# Patient Record
Sex: Female | Born: 1995 | Race: Black or African American | Hispanic: No | Marital: Single | Smoking: Never smoker
Health system: Southern US, Community
[De-identification: ages and names within clinical notes are randomized; demographics above are authoritative.]

## PROBLEM LIST (undated history)

## (undated) DIAGNOSIS — F419 Anxiety disorder, unspecified: Secondary | ICD-10-CM

---
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
## 2020-08-18 ENCOUNTER — Encounter (HOSPITAL_BASED_OUTPATIENT_CLINIC_OR_DEPARTMENT_OTHER): Payer: Self-pay | Admitting: Emergency Medicine

## 2020-08-18 ENCOUNTER — Emergency Department (HOSPITAL_BASED_OUTPATIENT_CLINIC_OR_DEPARTMENT_OTHER): Payer: BC Managed Care – PPO

## 2020-08-18 ENCOUNTER — Other Ambulatory Visit: Payer: Self-pay

## 2020-08-18 ENCOUNTER — Emergency Department (HOSPITAL_BASED_OUTPATIENT_CLINIC_OR_DEPARTMENT_OTHER)
Admission: EM | Admit: 2020-08-18 | Discharge: 2020-08-18 | Disposition: A | Discharge status: Home / Self Care | Payer: BC Managed Care – PPO | Attending: Emergency Medicine | Admitting: Emergency Medicine

## 2020-08-18 DIAGNOSIS — R4182 Altered mental status, unspecified: Secondary | ICD-10-CM

## 2020-08-18 DIAGNOSIS — R202 Paresthesia of skin: Secondary | ICD-10-CM | POA: Diagnosis not present

## 2020-08-18 DIAGNOSIS — R451 Restlessness and agitation: Secondary | ICD-10-CM | POA: Insufficient documentation

## 2020-08-18 DIAGNOSIS — Z20822 Contact with and (suspected) exposure to covid-19: Secondary | ICD-10-CM | POA: Diagnosis not present

## 2020-08-18 DIAGNOSIS — F410 Panic disorder [episodic paroxysmal anxiety] without agoraphobia: Secondary | ICD-10-CM | POA: Diagnosis not present

## 2020-08-18 DIAGNOSIS — R410 Disorientation, unspecified: Secondary | ICD-10-CM | POA: Insufficient documentation

## 2020-08-18 LAB — CBC WITH DIFFERENTIAL/PLATELET
Abs Immature Granulocytes: 0.1 10*3/uL — ABNORMAL HIGH (ref 0.00–0.07)
Basophils Absolute: 0 10*3/uL (ref 0.0–0.1)
Basophils Relative: 0 %
Eosinophils Absolute: 0 10*3/uL (ref 0.0–0.5)
Eosinophils Relative: 0 %
HCT: 37 % (ref 36.0–46.0)
Hemoglobin: 12.4 g/dL (ref 12.0–15.0)
Immature Granulocytes: 1 %
Lymphocytes Relative: 4 %
Lymphs Abs: 0.8 10*3/uL (ref 0.7–4.0)
MCH: 32.1 pg (ref 26.0–34.0)
MCHC: 33.5 g/dL (ref 30.0–36.0)
MCV: 95.9 fL (ref 80.0–100.0)
Monocytes Absolute: 0.5 10*3/uL (ref 0.1–1.0)
Monocytes Relative: 3 %
Neutro Abs: 17 10*3/uL — ABNORMAL HIGH (ref 1.7–7.7)
Neutrophils Relative %: 92 %
Platelets: 261 10*3/uL (ref 150–400)
RBC: 3.86 MIL/uL — ABNORMAL LOW (ref 3.87–5.11)
RDW: 11.9 % (ref 11.5–15.5)
WBC: 18.4 10*3/uL — ABNORMAL HIGH (ref 4.0–10.5)
nRBC: 0 % (ref 0.0–0.2)

## 2020-08-18 LAB — URINALYSIS, COMPLETE (UACMP) WITH MICROSCOPIC
Bilirubin Urine: NEGATIVE
Glucose, UA: NEGATIVE mg/dL
Hgb urine dipstick: NEGATIVE
Ketones, ur: 40 mg/dL — AB
Nitrite: NEGATIVE
Protein, ur: NEGATIVE mg/dL
Specific Gravity, Urine: 1.015 (ref 1.005–1.030)
pH: 8.5 — ABNORMAL HIGH (ref 5.0–8.0)

## 2020-08-18 LAB — RESPIRATORY PANEL BY RT PCR (FLU A&B, COVID)
Influenza A by PCR: NEGATIVE
Influenza B by PCR: NEGATIVE
SARS Coronavirus 2 by RT PCR: NEGATIVE

## 2020-08-18 LAB — SALICYLATE LEVEL: Salicylate Lvl: <7 mg/dL — ABNORMAL LOW (ref 7.0–30.0)

## 2020-08-18 LAB — RAPID URINE DRUG SCREEN, HOSP PERFORMED
Amphetamines: NOT DETECTED
Barbiturates: NOT DETECTED
Benzodiazepines: POSITIVE — AB
Cocaine: NOT DETECTED
Opiates: NOT DETECTED
Tetrahydrocannabinol: NOT DETECTED

## 2020-08-18 LAB — COMPREHENSIVE METABOLIC PANEL
ALT: 14 U/L (ref 0–44)
AST: 21 U/L (ref 15–41)
Albumin: 4.7 g/dL (ref 3.5–5.0)
Alkaline Phosphatase: 42 U/L (ref 38–126)
Anion gap: 11 (ref 5–15)
BUN: 7 mg/dL (ref 6–20)
CO2: 25 mmol/L (ref 22–32)
Calcium: 9.4 mg/dL (ref 8.9–10.3)
Chloride: 101 mmol/L (ref 98–111)
Creatinine, Ser: 0.93 mg/dL (ref 0.44–1.00)
GFR calc Af Amer: >60 mL/min (ref >60)
GFR calc non Af Amer: >60 mL/min (ref >60)
Glucose, Bld: 135 mg/dL — ABNORMAL HIGH (ref 70–99)
Potassium: 3.7 mmol/L (ref 3.5–5.1)
Sodium: 137 mmol/L (ref 135–145)
Total Bilirubin: 0.4 mg/dL (ref 0.3–1.2)
Total Protein: 8.5 g/dL — ABNORMAL HIGH (ref 6.5–8.1)

## 2020-08-18 LAB — ETHANOL: Alcohol, Ethyl (B): <10 mg/dL (ref <10)

## 2020-08-18 LAB — AMMONIA: Ammonia: 17 umol/L (ref 9–35)

## 2020-08-18 LAB — CBG MONITORING, ED: Glucose-Capillary: 145 mg/dL — ABNORMAL HIGH (ref 70–99)

## 2020-08-18 LAB — CK: Total CK: 262 U/L — ABNORMAL HIGH (ref 38–234)

## 2020-08-18 LAB — ACETAMINOPHEN LEVEL: Acetaminophen (Tylenol), Serum: <10 ug/mL — ABNORMAL LOW (ref 10–30)

## 2020-08-18 LAB — PREGNANCY, URINE: Preg Test, Ur: NEGATIVE

## 2020-08-18 LAB — HCG, QUANTITATIVE, PREGNANCY: hCG, Beta Chain, Quant, S: <1 m[IU]/mL (ref <5)

## 2020-08-18 MED ORDER — DIPHENHYDRAMINE HCL 50 MG/ML IJ SOLN
50.0000 mg | Freq: Once | INTRAMUSCULAR | Status: AC
  Administered 2020-08-18: 50 mg via INTRAMUSCULAR
  Filled 2020-08-18: qty 1

## 2020-08-18 MED ORDER — MIDAZOLAM HCL 2 MG/2ML IJ SOLN
INTRAMUSCULAR | Status: AC
  Filled 2020-08-18: qty 2

## 2020-08-18 MED ORDER — LORAZEPAM 2 MG/ML IJ SOLN: 2.0000 mg | Freq: Once | INTRAMUSCULAR | Status: AC

## 2020-08-18 MED ORDER — HALOPERIDOL LACTATE 5 MG/ML IJ SOLN: 5.0000 mg | Freq: Once | INTRAMUSCULAR | Status: AC

## 2020-08-18 MED ORDER — MIDAZOLAM HCL 2 MG/2ML IJ SOLN
2.0000 mg | Freq: Once | INTRAMUSCULAR | Status: AC
  Administered 2020-08-18: 2 mg via INTRAMUSCULAR

## 2020-08-18 MED ORDER — ZIPRASIDONE MESYLATE 20 MG IM SOLR: 10.0000 mg | Freq: Once | INTRAMUSCULAR | Status: DC

## 2020-08-18 MED ORDER — HYDROXYZINE HCL 25 MG PO TABS: 25.0000 mg | ORAL_TABLET | Freq: Three times a day (TID) | ORAL | 0 refills | Status: DC | PRN

## 2020-08-18 MED ORDER — LORAZEPAM 2 MG/ML IJ SOLN
INTRAMUSCULAR | Status: AC
  Administered 2020-08-18: 2 mg via INTRAMUSCULAR
  Filled 2020-08-18: qty 1

## 2020-08-18 MED ORDER — ACETAMINOPHEN 500 MG PO TABS
1000.0000 mg | ORAL_TABLET | Freq: Once | ORAL | Status: DC
  Filled 2020-08-18: qty 2

## 2020-08-18 MED ORDER — HALOPERIDOL LACTATE 5 MG/ML IJ SOLN
5.0000 mg | Freq: Once | INTRAMUSCULAR | Status: AC
  Administered 2020-08-18: 5 mg via INTRAMUSCULAR
  Filled 2020-08-18: qty 1

## 2020-08-18 MED ORDER — SODIUM CHLORIDE 0.9 % IV BOLUS
1000.0000 mL | Freq: Once | INTRAVENOUS | Status: AC
  Administered 2020-08-18: 1000 mL via INTRAVENOUS

## 2020-08-18 MED ORDER — HALOPERIDOL LACTATE 5 MG/ML IJ SOLN
INTRAMUSCULAR | Status: AC
  Administered 2020-08-18: 5 mg via INTRAMUSCULAR
  Filled 2020-08-18: qty 1

## 2020-08-18 NOTE — ED Notes (Signed)
Per VO Dr. Stevie Kern, rectal temp not needed.  Any route would be fine.

## 2020-08-18 NOTE — ED Notes (Signed)
Pt asked for another pillow.  Nurse left the room and brought one back immediately.  Pt had laid down on the original pillow and when nurse returned with the second pillow pt acted like she didn't know what was going on.  She didn't want another pillow.  Pt is cooperative with mother but seems altered in her thinking processes.

## 2020-08-18 NOTE — BH Assessment (Signed)
Tele Assessment Note   Patient Name: Courtney Mcbride MRN: [id number] Referring Physician: EDP Location of Patient: [facility] EDP Location of Provider: Behavioral Health TTS Department  Courtney Mcbride is a 24 y.o. female who presented to [facility] in a state of confusion and agitation last night.  Pt lives in [place] with her mother and father.  She works for [employer], and she is not followed by an outpatient psychiatrist or therapist.  Pt has never been treated inpatient before.  Pt's mother Haywood Lasso was present for assessment.  Pt had been sedated and was sleepy during assessment.  Per mother, Pt has recently endorsed strong feelings of anxiety and ''tingly sensations'' in her extremities.  Her PCP diagnosed with her low amounts of B12.  Last night, Pt woke her mother in a state of confusion and agitation.  She also reported tingly sensations.  Mother brought her to the hospital for evaluation.  At the hospital, Pt was given sedatives to calm her.  Pt endorsed anxiety, disturbed sleep, and episode of panic.  Pt denied suicidal ideation, past suicide attempt, homicidal ideation, hallucination, self-injurious behavior, and substance use concerns.  Pt and Pt's mother said they would feel safe if Pt returned home.  They also expressed interest in pursuing therapy for treatment of anxiety.  During assessment, Pt presented as alert and oriented.  She had fair eye contact (falling asleep), and she appeared appropriately groomed.  Pt's mood was anxious; Pt's affect was calm (sedated).  Pt's speech was normal in rate, rhythm, and volume.  Pt's thought processes were within normal range, and thought content was logical and goal-oriented.  There was no evidence of delusion.  Pt's memory and concentration were intact.  Insight, judgment, and impulse control were fair to good.  Consulted with L. Maisie Fus, NP, who determined that Pt does not meet inpatient criteria and may be  psych-cleared.  Diagnosis: Panic Disorder  Past Medical History:  Past Medical History:  Diagnosis Date  . Anxiety     History reviewed. No pertinent surgical history.  Family History:  Family History  Problem Relation Age of Onset  . Anxiety disorder Father   . Anxiety disorder Sister     Social History:  reports that she has never smoked. She does not have any smokeless tobacco history on file. She reports that she does not drink alcohol and does not use drugs.  Additional Social History:  Alcohol / Drug Use Pain Medications: See MAR Prescriptions: See MAR Over the Counter: See MAR History of alcohol / drug use?: No history of alcohol / drug abuse  CIWA: CIWA-Ar BP: 123/72 Pulse Rate: (!) 110 COWS:    Allergies: No Known Allergies  Home Medications: (Not in a hospital admission)   OB/GYN Status:  No LMP recorded.  General Assessment Data Location of Assessment: [facility] TTS Assessment: In system Is this a Tele or Face-to-Face Assessment?: Tele Assessment Is this an Initial Assessment or a Re-assessment for this encounter?: Initial Assessment Patient Accompanied by:: Parent Language Other than English: No Living Arrangements: Other (Comment) ([place] with mother and father) What gender do you identify as?: Female Date Telepsych consult ordered in [facility]: 08/18/20 Marital status: Single Maiden name:  Lozano) Pregnancy Status: No Living Arrangements: Parent Can pt return to current living arrangement?: Yes Admission Status: Voluntary Is patient capable of signing voluntary admission?: Yes Referral Source: Self/Family/Friend Insurance type:  Herbalist)     Crisis Care Plan Living Arrangements: Parent Name  of Psychiatrist:  (None) Name of Therapist:  (None)  Education Status Is patient currently in school?: No Is the patient employed, unemployed or receiving disability?: Employed  Risk to self with the past 6 months Suicidal Ideation: No Has  patient been a risk to self within the past 6 months prior to admission? : No Suicidal Intent: No Is patient at risk for suicide?: No Has patient had any suicidal plan within the past 6 months prior to admission? : No Access to Means: No What has been your use of drugs/alcohol within the last 12 months?:  (Denied) Intentional Self Injurious Behavior: None Family Suicide History: No Recent stressful life event(s): Loss (Comment), Other (Comment) (grandfather died; COVID) Persecutory voices/beliefs?: No Depression: No Substance abuse history and/or treatment for substance abuse?: No Suicide prevention information given to non-admitted patients: Not applicable  Risk to Others within the past 6 months Homicidal Ideation: No Does patient have any lifetime risk of violence toward others beyond the six months prior to admission? : No Thoughts of Harm to Others: No Current Homicidal Intent: No Current Homicidal Plan: No Access to Homicidal Means: No History of harm to others?: No Assessment of Violence: None Noted Does patient have access to weapons?: No Criminal Charges Pending?: No Does patient have a court date: No Is patient on probation?: No  Psychosis Hallucinations: None noted Delusions: None noted  Mental Status Report Appearance/Hygiene: Unremarkable, In hospital gown Eye Contact: Fair Motor Activity: Unremarkable Speech: Logical/coherent Level of Consciousness: Sedated Mood: Anxious Affect: Appropriate to circumstance Anxiety Level: Panic Attacks Thought Processes: Coherent, Relevant Judgement: Partial Orientation: Person, Place, Time, Situation Obsessive Compulsive Thoughts/Behaviors: None  Cognitive Functioning Concentration: Normal Memory: Recent Intact, Remote Intact Is patient IDD: No Insight: Fair Impulse Control: Fair Appetite: Good Have you had any weight changes? : No Change Sleep: No Change Total Hours of Sleep:  (8) Vegetative Symptoms:  None  ADLScreening [facility] Assessment Services) Patient's cognitive ability adequate to safely complete daily activities?: Yes Patient able to express need for assistance with ADLs?: Yes Independently performs ADLs?: Yes (appropriate for developmental age)  Prior Inpatient Therapy Prior Inpatient Therapy: No  Prior Outpatient Therapy Prior Outpatient Therapy: No Does patient have an ACCT team?: No Does patient have Intensive In-House Services?  : No Does patient have [facility] services? : No Does patient have P4CC services?: No  ADL Screening (condition at time of admission) Patient's cognitive ability adequate to safely complete daily activities?: Yes Is the patient deaf or have difficulty hearing?: No Does the patient have difficulty seeing, even when wearing glasses/contacts?: No Patient able to express need for assistance with ADLs?: Yes Does the patient have difficulty dressing or bathing?: No Independently performs ADLs?: Yes (appropriate for developmental age) Does the patient have difficulty walking or climbing stairs?: No Weakness of Legs: None Weakness of Arms/Hands: None  Home Assistive Devices/Equipment Home Assistive Devices/Equipment: None  Therapy Consults (therapy consults require a physician order) PT Evaluation Needed: No OT Evalulation Needed: No SLP Evaluation Needed: No Abuse/Neglect Assessment (Assessment to be complete while patient is alone) Abuse/Neglect Assessment Can Be Completed: Yes Physical Abuse: Denies Verbal Abuse: Denies Sexual Abuse: Denies Exploitation of patient/patient's resources: Denies Self-Neglect: Denies Values / Beliefs Cultural Requests During Hospitalization: None Spiritual Requests During Hospitalization: None Consults Spiritual Care Consult Needed: No Transition of Care Team Consult Needed: No Advance Directives (For Healthcare) Does Patient Have a Medical Advance Directive?: No          Disposition:   Disposition Initial Assessment  Completed for this Encounter: Yes  This service was provided via telemedicine using a 2-way, interactive audio and video technology.  Names of all persons participating in this telemedicine service and their role in this encounter. Name: Clemencia Helzer Role: Patient  Name: Mckenzi Buonomo Role: Patient's mother          Earline Mayotte 08/18/2020 3:01 PM

## 2020-08-18 NOTE — ED Notes (Signed)
ED Provider at bedside. 

## 2020-08-18 NOTE — ED Notes (Signed)
ED Provider at bedside discussing results and plan of care with pts Mom.

## 2020-08-18 NOTE — ED Provider Notes (Signed)
Signout note  24 year old lady presenting to ER with altered mental status.  Labs, CT head ordered and in process. Likely functional/psychiatric presentation in nature per initial impression of Dr. Bebe Shaggy.   7:30 AM Received signout from Dr. Bebe Shaggy, follow-up labs and imaging, reassess patient  8:16 AM Reassessed patient, initial labs grossly normal, patient resting in bed, awaiting UDS, will reassess and monitor closely, temp 100.9 F  10:19 AM reassessed patient, mentation improving, near baseline, will continue to monitor in ER, will ask TTS to evaluate  12:45 PM reassessed, continues to improve, awaiting TTS consult, temp came down prior to receiving any antipyretic  3:00 PM rechecked, at baseline per patient and mother, awaiting results of TTS consult, signed out to Dr. Silverio Lay. Suspect her presentation was most likely functional/psychiatric/anxiety in nature. Primary doctor following for B12 deficiency. She has no neck pain, stiffness, no ongoing neurologic deficits, doubt CNS infection or acute neurologic process. Given her current reassuring work up and reassuring clinical appearance at present, do not feel she needs additional medical work up and is appropriate for further psychiatric care per TTS recommendations. Anticipate likely out patient management at this point with plan for close PCP and out pt psych clinic.    Milagros Loll, MD 08/18/20 [phone]

## 2020-08-18 NOTE — ED Provider Notes (Signed)
  Physical Exam  BP 123/72   Pulse (!) 110   Temp 100.3 F (37.9 C) (Oral)   Resp (!) 22   Ht 5\' 6"  (1.676 m)   Wt 90.8 kg   SpO2 97%   BMI 32.31 kg/m   Physical Exam  ED Course/Procedures     Procedures  MDM  Patient is here for bizarre behavior and altered mental status.  Patient is back to baseline now.  Psychiatry is consulted and recommended discharge.  Stable for discharge and can follow-up outpatient.    , MD 08/18/20 [phone]

## 2020-08-18 NOTE — ED Notes (Signed)
Unable to get vitals at this time. Patient too agitated. Per provider will reassess after medication has had chance to take effect.

## 2020-08-18 NOTE — ED Notes (Signed)
TTS consult in progress. °

## 2020-08-18 NOTE — Discharge Instructions (Addendum)
Take hydroxyzine as needed for anxiety   See [facility] for follow up   Return to ER if you have worse tremors, lethargy, fever, vomiting, seizures

## 2020-08-18 NOTE — ED Provider Notes (Signed)
[facility] EMERGENCY DEPARTMENT Provider Note   CSN: [id number] Arrival date & time: 08/18/20  0305     History Chief Complaint  Patient presents with  . Altered Mental Status   Level 5 caveat due to altered mental status  Courtney Mcbride is a 24 y.o. female.  The history is provided by a parent. The history is limited by the condition of the patient.  Altered Mental Status Presenting symptoms: combativeness and disorientation   Severity:  Severe Most recent episode:  Today Episode history:  Single Timing:  Constant Progression:  Worsening Chronicity:  New Context: not head injury   Patient with history of anxiety and vitamin B12 deficiency presents with altered mental status.  Mother reports that patient woke up extremely disoriented.  She is speaking incoherently and would not respond to name or commands.  She has never had this before.  Mother reports she was just seen by her PCP for having numbness and paresthesias in her extremities.  She was found to have vitamin B12 deficiency and is started medications for that.  Patient takes birth control, but no other medications.  No recent trauma.  No drug abuse reported. Patient will get occasional migraine but no other acute issues. Patient has been under quite a bit of stress with work and family.      Past Medical History:  Diagnosis Date  . Anxiety     There are no problems to display for this patient.   History reviewed. No pertinent surgical history.   OB History   No obstetric history on file.     No family history on file.  Social History   Tobacco Use  . Smoking status: Never Smoker  Substance Use Topics  . Alcohol use: No  . Drug use: No    Home Medications Prior to Admission medications   Medication Sig Start Date End Date Taking? Authorizing Provider  AUROVELA FE 1/20 1-20 MG-MCG tablet Take 1 tablet by mouth daily. 08/02/20   [provider]  sulfamethoxazole-trimethoprim  (BACTRIM DS) 800-160 MG tablet Take 1 tablet by mouth 2 (two) times daily. 08/09/20   [provider]    Allergies    Patient has no known allergies.  Review of Systems   Review of Systems  Unable to perform ROS: Mental status change    Physical Exam Updated Vital Signs BP (!) 126/92   Resp (!) 22   Ht 1.676 m (5\' 6" )   Wt 90.8 kg   SpO2 98%   BMI 32.31 kg/m   Physical Exam CONSTITUTIONAL: Anxious, disheveled HEAD: Normocephalic/atraumatic, no signs of trauma EYES: EOMI/PERRL ENMT: Mucous membranes moist NECK: supple no meningeal signs SPINE/BACK:entire spine nontender CV: S1/S2 noted, no murmurs/rubs/gallops noted LUNGS: Lungs are clear to auscultation bilaterally, no apparent distress ABDOMEN: soft, nontender/nondistended NEURO: Pt is awake/alert, moves all extremities x4.  Patient is speaking incoherently and will follow commands.  She is intermittently agitated EXTREMITIES: pulses normal/equal, full ROM, no deformities SKIN: warm, color normal PSYCH: Agitated ED Results / Procedures / Treatments   Labs (all labs ordered are listed, but only abnormal results are displayed) Labs Reviewed  CBG MONITORING, ED - Abnormal; Notable for the following components:      Result Value   Glucose-Capillary 145 (*)    All other components within normal limits  RESPIRATORY PANEL BY RT PCR (FLU A&B, COVID)  COMPREHENSIVE METABOLIC PANEL  CBC WITH DIFFERENTIAL/PLATELET  URINALYSIS, COMPLETE (UACMP) WITH MICROSCOPIC  PREGNANCY, URINE  AMMONIA  RAPID URINE DRUG SCREEN, HOSP PERFORMED  HCG, QUANTITATIVE, PREGNANCY  ACETAMINOPHEN LEVEL  ETHANOL  SALICYLATE LEVEL  CK    EKG EKG Interpretation  Date/Time:  Wednesday August 18 2020 06:28:33 EDT Ventricular Rate:  85 PR Interval:    QRS Duration: 78 QT Interval:  343 QTC Calculation: 408 R Axis:   89 Text Interpretation: Incomplete analysis due to missing data in precordial lead(s) Sinus rhythm LVH by voltage  Baseline wander in lead(s) V2 Missing lead(s): V4 No previous ECGs available Confirmed by Zadie Rhine ([id number]) on 08/18/2020 6:35:48 AM   Radiology DG Chest Portable 1 View  Result Date: 08/18/2020 CLINICAL DATA:  Altered mental status today EXAM: PORTABLE CHEST 1 VIEW COMPARISON:  None. FINDINGS: Shallow inspiration. Heart size and pulmonary vascularity are normal. Lungs are clear. No pleural effusions. No pneumothorax. Mediastinal contours appear intact. IMPRESSION: No active disease. Electronically Signed   By: Burman Nieves M.D.   On: 08/18/2020 06:49    Procedures .Critical Care Performed by: Zadie Rhine, MD Authorized by: Zadie Rhine, MD   Critical care provider statement:    Critical care time (minutes):  90   Critical care start time:  08/18/2020 4:00 AM   Critical care end time:  08/18/2020 5:30 AM   Critical care time was exclusive of:  Separately billable procedures and treating other patients   Critical care was necessary to treat or prevent imminent or life-threatening deterioration of the following conditions:  CNS failure or compromise   Critical care was time spent personally by me on the following activities:  Development of treatment plan with patient or surrogate, evaluation of patient's response to treatment, examination of patient, pulse oximetry, ordering and review of radiographic studies, ordering and review of laboratory studies, ordering and performing treatments and interventions, re-evaluation of patient's condition and review of old charts   I assumed direction of critical care for this patient from another provider in my specialty: no        Medications Ordered in ED Medications  ziprasidone (GEODON) injection 10 mg (has no administration in time range)  haloperidol lactate (HALDOL) injection 5 mg (5 mg Intramuscular Given by Other 08/18/20 0325)  LORazepam (ATIVAN) injection 2 mg (2 mg Intramuscular Given by Other 08/18/20 0325)    diphenhydrAMINE (BENADRYL) injection 50 mg (50 mg Intramuscular Given 08/18/20 0402)  haloperidol lactate (HALDOL) injection 5 mg (5 mg Intramuscular Given 08/18/20 0402)  midazolam (VERSED) injection 2 mg (2 mg Intramuscular Given 08/18/20 0517)    ED Course  I have reviewed the triage vital signs and the nursing notes.  Pertinent labs & imaging results that were available during my care of the patient were reviewed by me and considered in my medical decision making (see chart for details).    MDM Rules/Calculators/A&P                          4:01 AM Patient arrives with mother for altered mental status and disorientation.  Mother reports that patient's had recent numbness and paresthesias in her extremities and was recently diagnosed with vitamin B12 deficiency.  Patient will have an occasional panic attack, but she has never had these types of episode previously. Patient is very disoriented, not following commands and is agitated. Verbal de-escalation has failed, patient has been given Haldol, Benadryl and Ativan. She will need to be monitored closely.  She will need to have a CT head as well as laboratory tests 5:14 AM Patient with  continued agitation despite doses of Haldol/Ativan/Benadryl. Versed has been ordered.  We are unable to get any lab work/EKG due to agitation, therefore unable to determine if there is prolonged QT 7:20 AM Patient is improving after multiple doses of antipsychotics and the benzodiazepines. She is more directable.  EKG does not reveal a prolonged QT. Chest x-ray was performed and is negative.  CT head is pending. Still unclear cause of altered mental status, will need full medical work-up prior to disposition. I checked on patient multiple times and also had multiple conversations with mother  Signed out to Dr. Stevie Kern at shift change to follow-up on imaging and labs.  Would consider psychiatric consult if all medical work-up is negative    This patient  presents to the ED for concern of altered mental status, this involves an extensive number of treatment options, and is a complaint that carries with it a high risk of complications and morbidity.  The differential diagnosis includes stroke, intracranial hemorrhage, substance abuse, psychosis, electrolyte imbalance   Lab Tests:   I Ordered, reviewed, and interpreted labs, which included electrolytes, complete blood count, alcohol and drug testing,  Medicines ordered:   I ordered medication Haldol/Benadryl/Ativan for agitation  Imaging Studies ordered:   I ordered imaging studies which included CT head and chest x-ray  I independently visualized and interpreted imaging which showed the chest x-ray was negative  Additional history obtained:   Additional history obtained from mother  Previous records obtained and reviewed    Reevaluation:  After the interventions stated above, I reevaluated the patient and found patient still confused but overall improving   Final Clinical Impression(s) / ED Diagnoses Final diagnoses:  Delirium    Rx / DC Orders ED Discharge Orders    None       Zadie Rhine, MD 08/18/20 [phone]

## 2020-08-18 NOTE — ED Triage Notes (Addendum)
Mother reports pt awoke disoriented. Pt will not speak and appears very disoriented. Will not follow directions. Mother denies any elicit drug use. Mother states she did give pt sublingual B12 last night.

## 2020-08-18 NOTE — ED Notes (Signed)
ED Provider at bedside discussing plan of care. 

## 2020-08-18 NOTE — ED Notes (Signed)
Provider informed that patient still agitated at bedside. No new orders at this time.

## 2020-08-18 NOTE — ED Notes (Signed)
ED Provider came to room.  Pt in CT.  Provider said the Geodon was for the purpose of doing the CT, and if they did not need it for that, not to give it at this time.

## 2021-03-07 ENCOUNTER — Encounter: Payer: Self-pay | Admitting: Allergy

## 2021-03-07 ENCOUNTER — Other Ambulatory Visit: Payer: Self-pay

## 2021-03-07 ENCOUNTER — Ambulatory Visit: Payer: BC Managed Care – PPO | Admitting: Allergy

## 2021-03-07 VITALS — BP 122/70 | HR 94 | Temp 98.6°F | Resp 18 | Ht 66.0 in | Wt 217.4 lb

## 2021-03-07 DIAGNOSIS — H1013 Acute atopic conjunctivitis, bilateral: Secondary | ICD-10-CM

## 2021-03-07 DIAGNOSIS — E739 Lactose intolerance, unspecified: Secondary | ICD-10-CM | POA: Diagnosis not present

## 2021-03-07 DIAGNOSIS — J3089 Other allergic rhinitis: Secondary | ICD-10-CM | POA: Diagnosis not present

## 2021-03-07 DIAGNOSIS — R21 Rash and other nonspecific skin eruption: Secondary | ICD-10-CM | POA: Diagnosis not present

## 2021-03-07 DIAGNOSIS — H1010 Acute atopic conjunctivitis, unspecified eye: Secondary | ICD-10-CM | POA: Insufficient documentation

## 2021-03-07 MED ORDER — EPINEPHRINE 0.3 MG/0.3ML IJ SOAJ: 0.3000 mg | INTRAMUSCULAR | 2 refills | Status: DC | PRN

## 2021-03-07 MED ORDER — OLOPATADINE HCL 0.2 % OP SOLN: 1.0000 [drp] | Freq: Every day | OPHTHALMIC | 5 refills | Status: DC | PRN

## 2021-03-07 MED ORDER — AZELASTINE-FLUTICASONE 137-50 MCG/ACT NA SUSP: 1.0000 | Freq: Two times a day (BID) | NASAL | 5 refills | Status: DC

## 2021-03-07 MED ORDER — MONTELUKAST SODIUM 10 MG PO TABS: 10.0000 mg | ORAL_TABLET | Freq: Every day | ORAL | 5 refills | Status: DC

## 2021-03-07 NOTE — Progress Notes (Signed)
New Patient Note  RE: Courtney Mcbride MRN: [id number] DOB: 11-Mar-1996 Date of Office Visit: 03/07/2021  Consult requested by: Kathaleen Bury* Primary care provider: Kerin Salen, PA-C  Chief Complaint: Allergy Testing  History of Present Illness: I had the pleasure of seeing Courtney Mcbride for initial evaluation at the [facility] on 03/07/2021. She is a 25 y.o. female, who is self-referred here by for the evaluation of allergic rhinitis. She is accompanied today by her mother who provided/contributed to the history.   She reports symptoms of sneezing, rhinorrhea, PND, itchy/watery eyes, rash. Symptoms have been going on for 15+ years. The symptoms are present mainly in the spring. Other triggers include exposure to pollen. Anosmia: no. Headache: yes. She has used zyrtec, allegra, Flonase, saline spray with some improvement in symptoms. Sinus infections: no. Previous work up includes: none. Previous ENT evaluation: not recently. Previous sinus imaging: no. History of nasal polyps: no. Last eye exam: in 2016. History of reflux: not recently.  Assessment and Plan: Courtney Mcbride is a 25 y.o. female with: Other allergic rhinitis Rhinoconjunctivitis symptoms for the past 15+ years mainly in the spring.  Tried Zyrtec, Allegra and Flonase with some benefit.  No prior allergy evaluation.  Today's skin testing showed: Positive to grass, weed pollen, tree, mold, cat, cockroach.   Start environmental control measures as below.  May use over the counter antihistamines such as Zyrtec (cetirizine), Claritin (loratadine), Allegra (fexofenadine), or Xyzal (levocetirizine) daily as needed. May take twice a day during flares. Start dymista (fluticasone + azelastine nasal spray combination) 1 spray per nostril twice a day. This replaces Flonase (fluticasone) for now. If it's not covered let us know.  Start Singulair (montelukast) 10mg  daily at night. Cautioned that  in some children/adults can experience behavioral changes including hyperactivity, agitation, depression, sleep disturbances and suicidal ideations. These side effects are rare, but if you notice them you should notify me and discontinue Singulair (montelukast).  Nasal saline spray (i.e., Simply Saline) or nasal saline lavage (i.e., NeilMed) is recommended as needed and prior to medicated nasal sprays.  May use olopatadine eye drops 0.2% once a day as needed for itchy/watery eyes.  Start allergy injections at our [facility] office.   Had a detailed discussion with patient/family that clinical history is suggestive of allergic rhinitis, and may benefit from allergy immunotherapy (AIT). Discussed in detail regarding the dosing, schedule, side effects (mild to moderate local allergic reaction and rarely systemic allergic reactions including anaphylaxis), and benefits (significant improvement in nasal symptoms, seasonal flares of asthma) of immunotherapy with the patient. There is significant time commitment involved with allergy shots, which includes weekly immunotherapy injections for first 9-12 months and then biweekly to monthly injections for 3-5 years. Consent was signed.  I have prescribed epinephrine injectable and demonstrated proper use. For mild symptoms you can take over the counter antihistamines such as Benadryl and monitor symptoms closely. If symptoms worsen or if you have severe symptoms including breathing issues, throat closure, significant swelling, whole body hives, severe diarrhea and vomiting, lightheadedness then inject epinephrine and seek immediate medical care afterwards.  Action plan given.  Allergic conjunctivitis of both eyes  See assessment and plan as above for allergic rhinitis.  Rash and other nonspecific skin eruption Sometimes breaks out on rash on the right wrist area. No rash today.  May use over the counter topical hydrocortisone cream as needed.   Take  pictures of the rash  See below for proper skin  care.   Return in about 4 months (around 07/07/2021).  Meds ordered this encounter  Medications  . Azelastine-Fluticasone 137-50 MCG/ACT SUSP    Sig: Place 1 spray into the nose in the morning and at bedtime.    Dispense:  23 g    Refill:  5    Failed Flonase  . Olopatadine HCl 0.2 % SOLN    Sig: Apply 1 drop to eye daily as needed (itchy/watery eyes).    Dispense:  2.5 mL    Refill:  5  . montelukast (SINGULAIR) 10 MG tablet    Sig: Take 1 tablet (10 mg total) by mouth at bedtime.    Dispense:  30 tablet    Refill:  5  . EPINEPHrine (AUVI-Q) 0.3 mg/0.3 mL IJ SOAJ injection    Sig: Inject 0.3 mg into the muscle as needed for anaphylaxis.    Dispense:  1 each    Refill:  2    Phone [phone]   Lab Orders  No laboratory test(s) ordered today    Other allergy screening: Asthma: no Food allergy: no Medication allergy: no Hymenoptera allergy: no Urticaria: no Eczema:no  Rash on right wrist area - uses Vaseline or Aquaphor.  History of recurrent infections suggestive of immunodeficency: no  Diagnostics: Skin Testing: Environmental allergy panel. Positive to grass, weed pollen, tree, mold, cat, cockroach.  Results discussed with patient/family.  Airborne Adult Perc - 03/07/21 1400    Time Antigen Placed 1400    Allergen Manufacturer Waynette ButteryGreer    Location Back    Number of Test 59    1. Control-Buffer 50% Glycerol Negative    2. Control-Histamine 1 mg/ml 2+    3. Albumin saline Negative    4. Bahia 2+    5. [place] 3+    6. Johnson Negative    7. Kentucky Blue 4+    8. Meadow Fescue 3+    9. Perennial Rye 3+    10. Sweet Vernal 2+    11. Timothy 4+    12. Cocklebur Negative    13. Burweed Marshelder Negative    14. Ragweed, short Negative    15. Ragweed, Giant Negative    16. Plantain,  English 2+    17. Lamb's Quarters 2+    18. Sheep Sorrell --   +/-   19. Rough Pigweed 2+    20. Marsh Elder, Rough Negative     21. Mugwort, Common Negative    22. Ash mix Negative    23. Birch mix 4+    24. Beech American 2+    25. Box, Elder 4+    26. Cedar, red Negative    27. Cottonwood, Eastern 2+    28. Elm mix Negative    29. Hickory 4+    30. Maple mix 3+    31. Oak, Guinea-Biss[place]astern mix 4+    32. Pecan Pollen 4+    33. Pine mix --   +/-   34. Sycamore Eastern 2+    35. Walnut, Black Pollen 4+    36. Alternaria alternata 2+    37. Cladosporium Herbarum Negative    38. Aspergillus mix Negative    39. Penicillium mix Negative    40. Bipolaris sorokiniana (Helminthosporium) Negative    41. Drechslera spicifera (Curvularia) Negative    42. Mucor plumbeus Negative    43. Fusarium moniliforme 2+    44. Aureobasidium pullulans (pullulara) Negative    45. Rhizopus oryzae Negative    46. Botrytis cinera 2+  47. Epicoccum nigrum 2+    48. Phoma betae Negative    49. Candida Albicans Negative    50. Trichophyton mentagrophytes Negative    51. Mite, D Farinae  5,000 AU/ml Negative    52. Mite, D Pteronyssinus  5,000 AU/ml Negative    53. Cat Hair 10,000 BAU/ml Negative    54.  Dog Epithelia Negative    55. Mixed Feathers Negative    56. Horse Epithelia Negative    57. Cockroach, German Negative    58. Mouse Negative    59. Tobacco Leaf Negative          Intradermal - 03/07/21 1706    Time Antigen Placed 1706    Allergen Manufacturer Waynette Buttery    Location Arm    Number of Test 9    Intradermal Select    Control Negative    Johnson 3+    Ragweed mix 3+    Mold 2 2+    Mold 3 Negative    Cat 2+    Dog Negative    Cockroach 2+    Mite mix Negative           Past Medical History: Patient Active Problem List   Diagnosis Date Noted  . Other allergic rhinitis 03/07/2021  . Allergic conjunctivitis of both eyes 03/07/2021  . Lactose intolerance 03/07/2021  . Rash and other nonspecific skin eruption 03/07/2021   Past Medical History:  Diagnosis Date  . Anxiety    Past Surgical  History: History reviewed. No pertinent surgical history. Medication List:  Current Outpatient Medications  Medication Sig Dispense Refill  . AUROVELA FE 1/20 1-20 MG-MCG tablet Take 1 tablet by mouth daily.    . Azelastine-Fluticasone 137-50 MCG/ACT SUSP Place 1 spray into the nose in the morning and at bedtime. 23 g 5  . EPINEPHrine (AUVI-Q) 0.3 mg/0.3 mL IJ SOAJ injection Inject 0.3 mg into the muscle as needed for anaphylaxis. 1 each 2  . montelukast (SINGULAIR) 10 MG tablet Take 1 tablet (10 mg total) by mouth at bedtime. 30 tablet 5  . Olopatadine HCl 0.2 % SOLN Apply 1 drop to eye daily as needed (itchy/watery eyes). 2.5 mL 5   No current facility-administered medications for this visit.   Allergies: No Known Allergies Social History: Social History   Socioeconomic History  . Marital status: Single    Spouse name: Not on file  . Number of children: Not on file  . Years of education: Not on file  . Highest education level: Not on file  Occupational History    Employer: [employer]  Tobacco Use  . Smoking status: Never Smoker  . Smokeless tobacco: Never Used  Substance and Sexual Activity  . Alcohol use: No  . Drug use: No  . Sexual activity: Not Currently  Other Topics Concern  . Not on file  Social History Narrative   Pt lives with her mother and father.  She works with [employer], and she does not have an outpatient psychiatrist or therapist.   Social Determinants of Health   Financial Resource Strain: Not on file  Food Insecurity: Not on file  Transportation Needs: Not on file  Physical Activity: Not on file  Stress: Not on file  Social Connections: Not on file   Lives in a 88 year old apartment. Smoking: denies Occupation: Teacher, music HistorySurveyor, minerals in the house: no Carpet in the family room: no Carpet in the bedroom: yes Heating: electric Cooling: central Pet:  no  Family History: Family History  Problem Relation Age  of Onset  . Anxiety disorder Father   . Anxiety disorder Sister    Problem                               Relation Eczema                                Sister  Allergic rhino conjunctivitis     Sister, father, mother  Review of Systems  Constitutional: Negative for appetite change, chills, fever and unexpected weight change.  HENT: Positive for congestion, postnasal drip, rhinorrhea and sneezing.   Eyes: Positive for itching.  Respiratory: Negative for cough, chest tightness, shortness of breath and wheezing.   Cardiovascular: Negative for chest pain.  Gastrointestinal: Negative for abdominal pain.  Genitourinary: Negative for difficulty urinating.  Skin: Positive for rash.  Allergic/Immunologic: Positive for environmental allergies.  Neurological: Positive for headaches.   Objective: BP 122/70 (BP Location: Left Arm, Patient Position: Sitting, Cuff Size: Large)   Pulse 94   Temp 98.6 F (37 C) (Temporal)   Resp 18   Ht 5\' 6"  (1.676 m)   Wt 217 lb 6.4 oz (98.6 kg)   SpO2 100%   BMI 35.09 kg/m  Body mass index is 35.09 kg/m. Physical Exam Vitals and nursing note reviewed. Exam conducted with a chaperone present.  Constitutional:      Appearance: Normal appearance. She is well-developed.  HENT:     Head: Normocephalic and atraumatic.     Right Ear: External ear normal. There is impacted cerumen.     Left Ear: Tympanic membrane and external ear normal.     Nose: Nose normal.     Mouth/Throat:     Mouth: Mucous membranes are moist.     Pharynx: Oropharynx is clear.  Eyes:     Conjunctiva/sclera: Conjunctivae normal.  Cardiovascular:     Rate and Rhythm: Normal rate and regular rhythm.     Heart sounds: Normal heart sounds. No murmur heard. No friction rub. No gallop.   Pulmonary:     Effort: Pulmonary effort is normal.     Breath sounds: Normal breath sounds. No wheezing, rhonchi or rales.  Musculoskeletal:     Cervical back: Neck supple.  Skin:    General: Skin  is warm.     Findings: No rash.  Neurological:     Mental Status: She is alert and oriented to person, place, and time.  Psychiatric:        Behavior: Behavior normal.    The plan was reviewed with the patient/family, and all questions/concerned were addressed.  It was my pleasure to see Halei today and participate in her care. Please feel free to contact me with any questions or concerns.  Sincerely,  Leavy Cella, DO Allergy & Immunology  [facility] office: [phone] [facility] office: [phone]

## 2021-03-07 NOTE — Assessment & Plan Note (Signed)
Sometimes breaks out on rash on the right wrist area. No rash today.  May use over the counter topical hydrocortisone cream as needed.   Take pictures of the rash  See below for proper skin care.

## 2021-03-07 NOTE — Assessment & Plan Note (Signed)
Rhinoconjunctivitis symptoms for the past 15+ years mainly in the spring.  Tried Zyrtec, Allegra and Flonase with some benefit.  No prior allergy evaluation.  Today's skin testing showed: Positive to grass, weed pollen, tree, mold, cat, cockroach.   Start environmental control measures as below.  May use over the counter antihistamines such as Zyrtec (cetirizine), Claritin (loratadine), Allegra (fexofenadine), or Xyzal (levocetirizine) daily as needed. May take twice a day during flares. Start dymista (fluticasone + azelastine nasal spray combination) 1 spray per nostril twice a day. This replaces Flonase (fluticasone) for now. If it's not covered let us know.  Start Singulair (montelukast) 10mg  daily at night. Cautioned that in some children/adults can experience behavioral changes including hyperactivity, agitation, depression, sleep disturbances and suicidal ideations. These side effects are rare, but if you notice them you should notify me and discontinue Singulair (montelukast).  Nasal saline spray (i.e., Simply Saline) or nasal saline lavage (i.e., NeilMed) is recommended as needed and prior to medicated nasal sprays.  May use olopatadine eye drops 0.2% once a day as needed for itchy/watery eyes.  Start allergy injections at our [facility] office.   Had a detailed discussion with patient/family that clinical history is suggestive of allergic rhinitis, and may benefit from allergy immunotherapy (AIT). Discussed in detail regarding the dosing, schedule, side effects (mild to moderate local allergic reaction and rarely systemic allergic reactions including anaphylaxis), and benefits (significant improvement in nasal symptoms, seasonal flares of asthma) of immunotherapy with the patient. There is significant time commitment involved with allergy shots, which includes weekly immunotherapy injections for first 9-12 months and then biweekly to monthly injections for 3-5 years. Consent was  signed.  I have prescribed epinephrine injectable and demonstrated proper use. For mild symptoms you can take over the counter antihistamines such as Benadryl and monitor symptoms closely. If symptoms worsen or if you have severe symptoms including breathing issues, throat closure, significant swelling, whole body hives, severe diarrhea and vomiting, lightheadedness then inject epinephrine and seek immediate medical care afterwards.  Action plan given.

## 2021-03-07 NOTE — Assessment & Plan Note (Signed)
>>  ASSESSMENT AND PLAN FOR OTHER ALLERGIC RHINITIS WRITTEN ON 03/07/2021  9:33 PM BY Ellamae Sia, DO  Rhinoconjunctivitis symptoms for the past 15+ years mainly in the spring.  Tried Zyrtec, Allegra and Flonase with some benefit.  No prior allergy evaluation. Today's skin testing showed: Positive to grass, weed pollen, tree, mold, cat, cockroach.  Start environmental control measures as below. May use over the counter antihistamines such as Zyrtec (cetirizine), Claritin (loratadine), Allegra (fexofenadine), or Xyzal (levocetirizine) daily as needed. May take twice a day during flares. Start dymista (fluticasone + azelastine nasal spray combination) 1 spray per nostril twice a day. This replaces Flonase (fluticasone) for now. If it's not covered let us know.  Start Singulair (montelukast) 10mg  daily at night. Cautioned that in some children/adults can experience behavioral changes including hyperactivity, agitation, depression, sleep disturbances and suicidal ideations. These side effects are rare, but if you notice them you should notify me and discontinue Singulair (montelukast). Nasal saline spray (i.e., Simply Saline) or nasal saline lavage (i.e., NeilMed) is recommended as needed and prior to medicated nasal sprays. May use olopatadine eye drops 0.2% once a day as needed for itchy/watery eyes. Start allergy injections at our [facility] office.  Had a detailed discussion with patient/family that clinical history is suggestive of allergic rhinitis, and may benefit from allergy immunotherapy (AIT). Discussed in detail regarding the dosing, schedule, side effects (mild to moderate local allergic reaction and rarely systemic allergic reactions including anaphylaxis), and benefits (significant improvement in nasal symptoms, seasonal flares of asthma) of immunotherapy with the patient. There is significant time commitment involved with allergy shots, which includes weekly immunotherapy injections for first  9-12 months and then biweekly to monthly injections for 3-5 years. Consent was signed. I have prescribed epinephrine injectable and demonstrated proper use. For mild symptoms you can take over the counter antihistamines such as Benadryl and monitor symptoms closely. If symptoms worsen or if you have severe symptoms including breathing issues, throat closure, significant swelling, whole body hives, severe diarrhea and vomiting, lightheadedness then inject epinephrine and seek immediate medical care afterwards. Action plan given.

## 2021-03-07 NOTE — Assessment & Plan Note (Signed)
   See assessment and plan as above for allergic rhinitis.  

## 2021-03-07 NOTE — Patient Instructions (Addendum)
Today's skin testing showed: Positive to grass, weed pollen, tree, mold, cat, cockroach.   Environmental allergies  Start environmental control measures as below.  May use over the counter antihistamines such as Zyrtec (cetirizine), Claritin (loratadine), Allegra (fexofenadine), or Xyzal (levocetirizine) daily as needed. May take twice a day during flares. Start dymista (fluticasone + azelastine nasal spray combination) 1 spray per nostril twice a day. This replaces Flonase (fluticasone) for now. If it's not covered let us know.  Start Singulair (montelukast) 10mg  daily at night. Cautioned that in some children/adults can experience behavioral changes including hyperactivity, agitation, depression, sleep disturbances and suicidal ideations. These side effects are rare, but if you notice them you should notify me and discontinue Singulair (montelukast).  Nasal saline spray (i.e., Simply Saline) or nasal saline lavage (i.e., NeilMed) is recommended as needed and prior to medicated nasal sprays.  May use olopatadine eye drops 0.2% once a day as needed for itchy/watery eyes.  Start allergy injections at our [facility] office.   Had a detailed discussion with patient/family that clinical history is suggestive of allergic rhinitis, and may benefit from allergy immunotherapy (AIT). Discussed in detail regarding the dosing, schedule, side effects (mild to moderate local allergic reaction and rarely systemic allergic reactions including anaphylaxis), and benefits (significant improvement in nasal symptoms, seasonal flares of asthma) of immunotherapy with the patient. There is significant time commitment involved with allergy shots, which includes weekly immunotherapy injections for first 9-12 months and then biweekly to monthly injections for 3-5 years. Consent was signed.  I have prescribed epinephrine injectable and demonstrated proper use. For mild symptoms you can take over the counter antihistamines  such as Benadryl and monitor symptoms closely. If symptoms worsen or if you have severe symptoms including breathing issues, throat closure, significant swelling, whole body hives, severe diarrhea and vomiting, lightheadedness then inject epinephrine and seek immediate medical care afterwards.  Action plan given.   Skin:  May use over the counter topical hydrocortisone cream as needed.   Take pictures of the rash  See below for proper skin care.   Follow up in 4 months or sooner if needed.  Follow up in 3 weeks for first injection at our [facility] office.   Reducing Pollen Exposure . Pollen seasons: trees (spring), grass (summer) and ragweed/weeds (fall). 03-29-1980 Keep windows closed in your home and car to lower pollen exposure.  Marland Kitchen air conditioning in the bedroom and throughout the house if possible.  . Avoid going out in dry windy days - especially early morning. . Pollen counts are highest between 5 - 10 AM and on dry, hot and windy days.  . Save outside activities for late afternoon or after a heavy rain, when pollen levels are lower.  . Avoid mowing of grass if you have grass pollen allergy. Lilian Kapur Be aware that pollen can also be transported indoors on people and pets.  . Dry your clothes in an automatic dryer rather than hanging them outside where they might collect pollen.  . Rinse hair and eyes before bedtime. Mold Control . Mold and fungi can grow on a variety of surfaces provided certain temperature and moisture conditions exist.  . Outdoor molds grow on plants, decaying vegetation and soil. The major outdoor mold, Alternaria and Cladosporium, are found in very high numbers during hot and dry conditions. Generally, a late summer - fall peak is seen for common outdoor fungal spores. Rain will temporarily lower outdoor mold spore count, but counts rise rapidly when the  rainy period ends. . The most important indoor molds are Aspergillus and Penicillium. Dark, humid and poorly  ventilated basements are ideal sites for mold growth. The next most common sites of mold growth are the bathroom and the kitchen. Outdoor (Seasonal) Mold Control . Use air conditioning and keep windows closed. . Avoid exposure to decaying vegetation. Marland Kitchen Avoid leaf raking. . Avoid grain handling. . Consider wearing a face mask if working in moldy areas.  Indoor (Perennial) Mold Control  . Maintain humidity below 50%. . Get rid of mold growth on hard surfaces with water, detergent and, if necessary, 5% bleach (do not mix with other cleaners). Then dry the area completely. If mold covers an area more than 10 square feet, consider hiring an indoor environmental professional. . For clothing, washing with soap and water is best. If moldy items cannot be cleaned and dried, throw them away. . Remove sources e.g. contaminated carpets. . Repair and seal leaking roofs or pipes. Using dehumidifiers in damp basements may be helpful, but empty the water and clean units regularly to prevent mildew from forming. All rooms, especially basements, bathrooms and kitchens, require ventilation and cleaning to deter mold and mildew growth. Avoid carpeting on concrete or damp floors, and storing items in damp areas. Mold Control . Mold and fungi can grow on a variety of surfaces provided certain temperature and moisture conditions exist.  . Outdoor molds grow on plants, decaying vegetation and soil. The major outdoor mold, Alternaria and Cladosporium, are found in very high numbers during hot and dry conditions. Generally, a late summer - fall peak is seen for common outdoor fungal spores. Rain will temporarily lower outdoor mold spore count, but counts rise rapidly when the rainy period ends. . The most important indoor molds are Aspergillus and Penicillium. Dark, humid and poorly ventilated basements are ideal sites for mold growth. The next most common sites of mold growth are the bathroom and the kitchen. Outdoor  (Seasonal) Mold Control . Use air conditioning and keep windows closed. . Avoid exposure to decaying vegetation. Marland Kitchen Avoid leaf raking. . Avoid grain handling. . Consider wearing a face mask if working in moldy areas.  Indoor (Perennial) Mold Control  . Maintain humidity below 50%. . Get rid of mold growth on hard surfaces with water, detergent and, if necessary, 5% bleach (do not mix with other cleaners). Then dry the area completely. If mold covers an area more than 10 square feet, consider hiring an indoor environmental professional. . For clothing, washing with soap and water is best. If moldy items cannot be cleaned and dried, throw them away. . Remove sources e.g. contaminated carpets. . Repair and seal leaking roofs or pipes. Using dehumidifiers in damp basements may be helpful, but empty the water and clean units regularly to prevent mildew from forming. All rooms, especially basements, bathrooms and kitchens, require ventilation and cleaning to deter mold and mildew growth. Avoid carpeting on concrete or damp floors, and storing items in damp areas. Cockroach Allergen Avoidance Cockroaches are often found in the homes of densely populated urban areas, schools or commercial buildings, but these creatures can lurk almost anywhere. This does not mean that you have a dirty house or living area. . Block all areas where roaches can enter the home. This includes crevices, wall cracks and windows.  . Cockroaches need water to survive, so fix and seal all leaky faucets and pipes. Have an exterminator go through the house when your family and pets are gone to  eliminate any remaining roaches. Marland Kitchen Keep food in lidded containers and put pet food dishes away after your pets are done eating. Vacuum and sweep the floor after meals, and take out garbage and recyclables. Use lidded garbage containers in the kitchen. Wash dishes immediately after use and clean under stoves, refrigerators or toasters where crumbs  can accumulate. Wipe off the stove and other kitchen surfaces and cupboards regularly.  Skin care recommendations  Bath time: . Always use lukewarm water. AVOID very hot or cold water. Marland Kitchen Keep bathing time to 5-10 minutes. . Do NOT use bubble bath. . Use a mild soap and use just enough to wash the dirty areas. . Do NOT scrub skin vigorously.  . After bathing, pat dry your skin with a towel. Do NOT rub or scrub the skin.  Moisturizers and prescriptions:  . ALWAYS apply moisturizers immediately after bathing (within 3 minutes). This helps to lock-in moisture. . Use the moisturizer several times a day over the whole body. Peri Jefferson summer moisturizers include: Aveeno, CeraVe, Cetaphil. Peri Jefferson winter moisturizers include: Aquaphor, Vaseline, Cerave, Cetaphil, Eucerin, Vanicream. . When using moisturizers along with medications, the moisturizer should be applied about one hour after applying the medication to prevent diluting effect of the medication or moisturize around where you applied the medications. When not using medications, the moisturizer can be continued twice daily as maintenance.  Laundry and clothing: . Avoid laundry products with added color or perfumes. . Use unscented hypo-allergenic laundry products such as Tide free, Cheer free & gentle, and All free and clear.  . If the skin still seems dry or sensitive, you can try double-rinsing the clothes. . Avoid tight or scratchy clothing such as wool. . Do not use fabric softeners or dyer sheets.

## 2021-03-08 DIAGNOSIS — J301 Allergic rhinitis due to pollen: Secondary | ICD-10-CM

## 2021-03-08 NOTE — Progress Notes (Signed)
VIALS EXP 03-08-22 

## 2021-03-08 NOTE — Progress Notes (Signed)
Aeroallergen Immunotherapy    Patient Details  Name: Courtney Mcbride  MRN: [id number]  Date of Birth: January 25, 1996   Order 2 of 2   Vial Label: [id number]-Cr   0.2 ml (Volume) 1:20 Concentration -- Alternaria alternata  0.2 ml (Volume) 1:10 Concentration -- Aspergillus mix  0.2 ml (Volume) 1:10 Concentration -- Penicillium mix  0.2 ml (Volume) 1:10 Concentration -- Fusarium moniliforme  0.2 ml (Volume) 1:40 Concentration -- Epicoccum nigrum  0.2 ml (Volume) 1:40 Concentration -- Phoma betae  0.5 ml (Volume) 1:10 Concentration -- Cat Hair  0.3 ml (Volume) 1:20 Concentration -- Cockroach, German    2.0 ml Extract Subtotal  3.0 ml Diluent  5.0 ml Maintenance Total    Final Concentration above is stated in weight/volume (wt/vol). Allergen units (AU/ml) biological units (BAU/ml). The total volume is 5 ml.    Schedule: B   Special Instructions: start with silver vial (1:1,000,000) once per week.

## 2021-03-08 NOTE — Progress Notes (Signed)
Aeroallergen Immunotherapy    Patient Details  Name: SHANZAY HEPWORTH  MRN: [id number]  Date of Birth: 1996-02-25   Order 1 of 2   Vial Label: [id number]   0.3 ml (Volume) BAU Concentration -- 7 Grass Mix* 100,000 ([address] [place], [place], [place], Perennial [place], Red[place], Sweet Vernal, Timothy)  0.2 ml (Volume) 1:20 Concentration -- [place]  0.3 ml (Volume) BAU Concentration -- [place] 10,000  0.2 ml (Volume) 1:20 Concentration -- Johnson  0.3 ml (Volume) 1:20 Concentration -- Ragweed Mix  0.5 ml (Volume) 1:20 Concentration -- Weed Mix*  0.5 ml (Volume) 1:20 Concentration -- Eastern 10 Tree Mix (also Sweet Gum)  0.2 ml (Volume) 1:20 Concentration -- Box Elder  0.2 ml (Volume) 1:10 Concentration -- Pecan Pollen  0.2 ml (Volume) 1:10 Concentration -- Pine Mix  0.2 ml (Volume) 1:20 Concentration -- Walnut, Black Pollen    3.1 ml Extract Subtotal  1.9 ml Diluent  5.0 ml Maintenance Total    Final Concentration above is stated in weight/volume (wt/vol). Allergen units (AU/ml) biological units (BAU/ml). The total volume is 5 ml.    Schedule: B   Special Instructions: start with silver vial (1:1,000,000)

## 2021-03-09 DIAGNOSIS — J3089 Other allergic rhinitis: Secondary | ICD-10-CM | POA: Diagnosis not present

## 2021-03-21 ENCOUNTER — Other Ambulatory Visit: Payer: Self-pay | Admitting: *Deleted

## 2021-03-28 ENCOUNTER — Ambulatory Visit: Payer: BC Managed Care – PPO

## 2021-03-30 ENCOUNTER — Ambulatory Visit: Payer: BC Managed Care – PPO | Admitting: Allergy

## 2021-05-01 IMAGING — CT CT HEAD W/O CM
4 of 10 series · 14 of 47 positions shown, 16 images · non-contrast
Comparison: None.

CLINICAL DATA: Delirium

EXAM:
CT HEAD WITHOUT CONTRAST
TECHNIQUE: Contiguous axial images were obtained from the base of the skull
through the vertex without intravenous contrast.

[Series 4: head wo · axial · 0.45mm/px · z∈[-323,-218]mm · 5 of 33 slices shown, 7 images]
[im 6/33  brain]
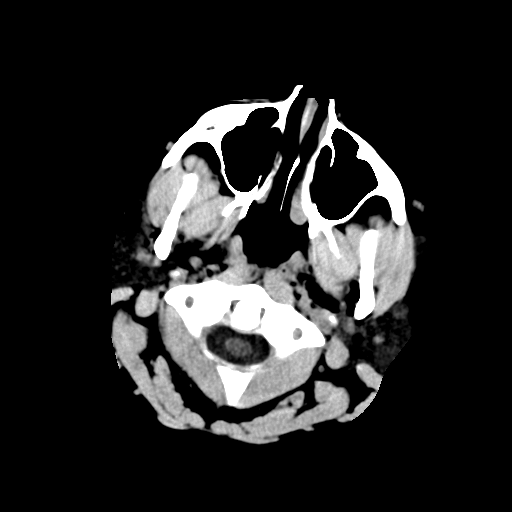
[im 6/33  bone]
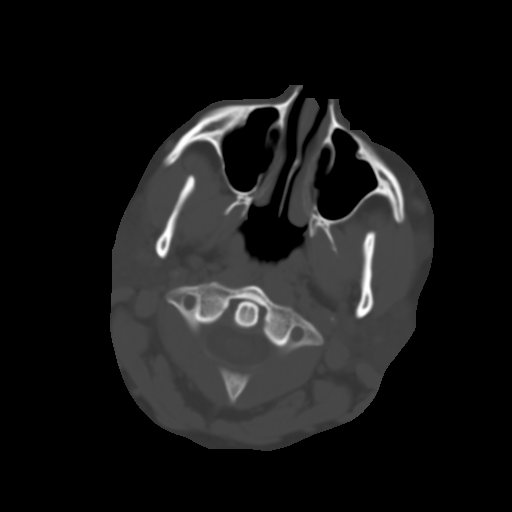
[im 11/33  brain]
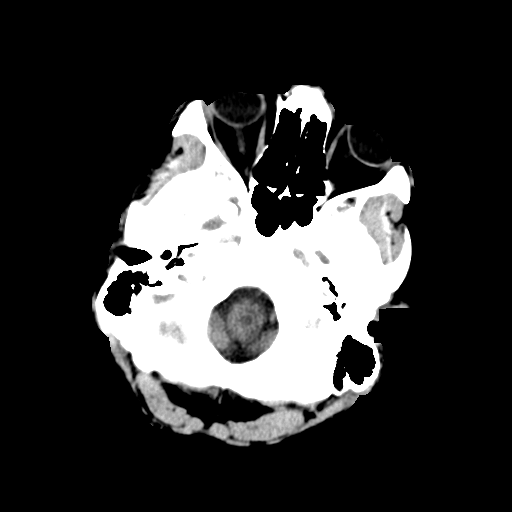
[im 17/33  brain]
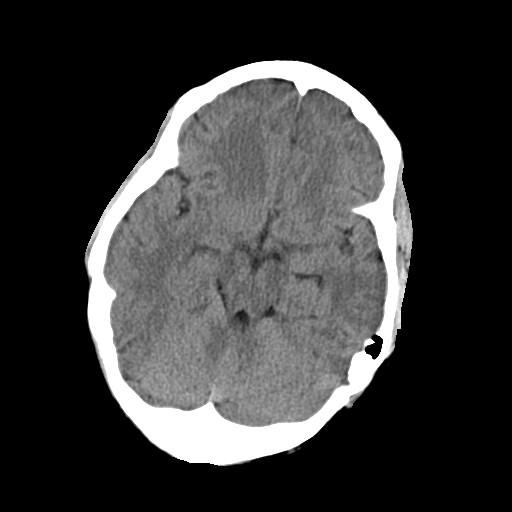
[im 22/33  brain]
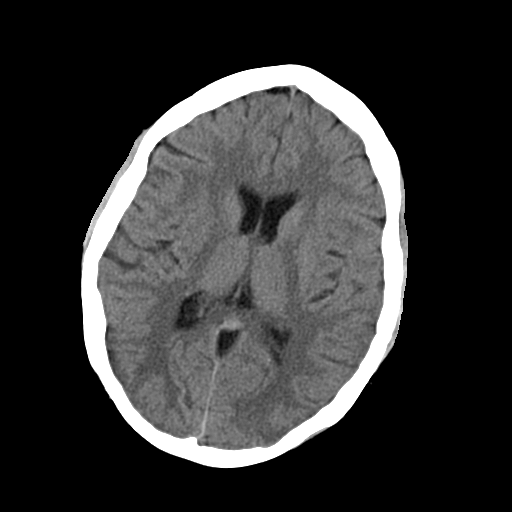
[im 27/33  brain]
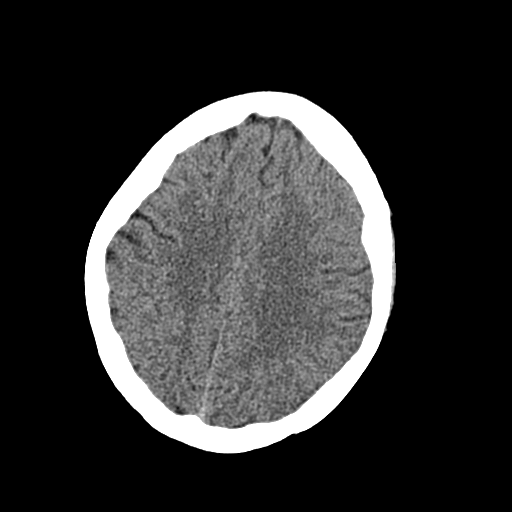
[im 27/33  bone]
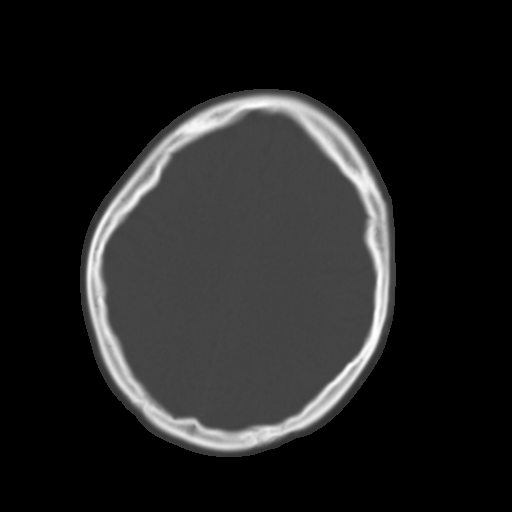

[Series 6: cor soft · coronal · 0.37mm/px · 3 of 66 slices shown]
[im 17/66  brain]
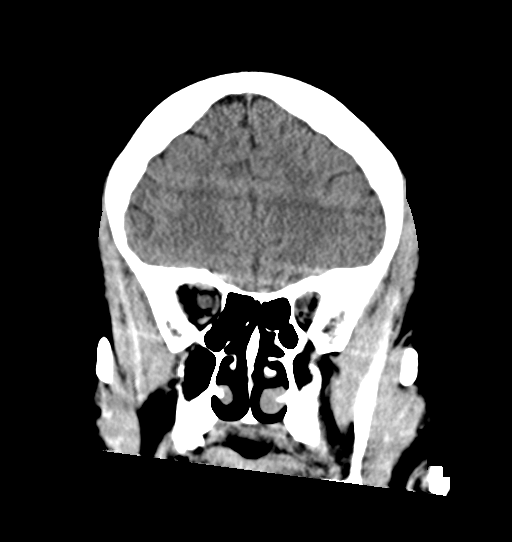
[im 33/66  brain]
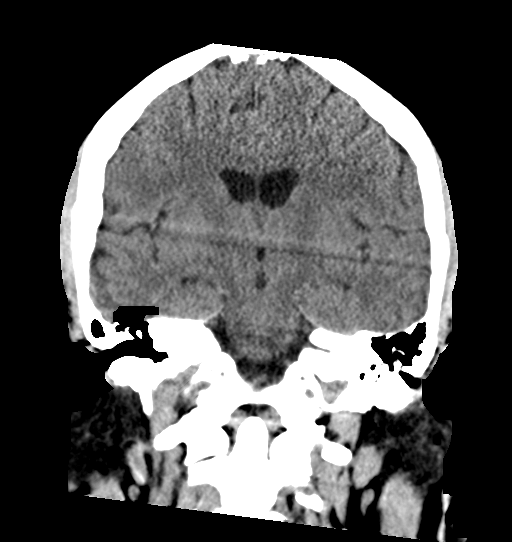
[im 49/66  brain]
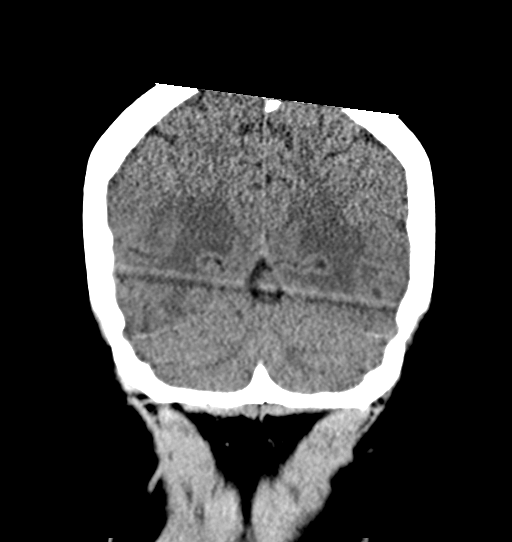

[Series 11: sag soft · sagittal · 0.19mm/px · 3 of 45 slices shown]
[im 9/45  brain]
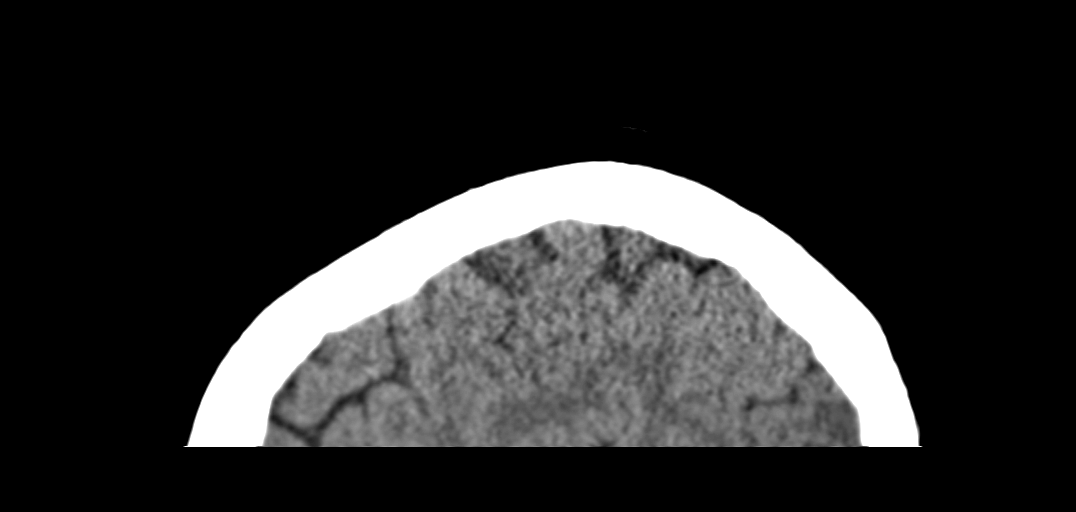
[im 18/45  brain]
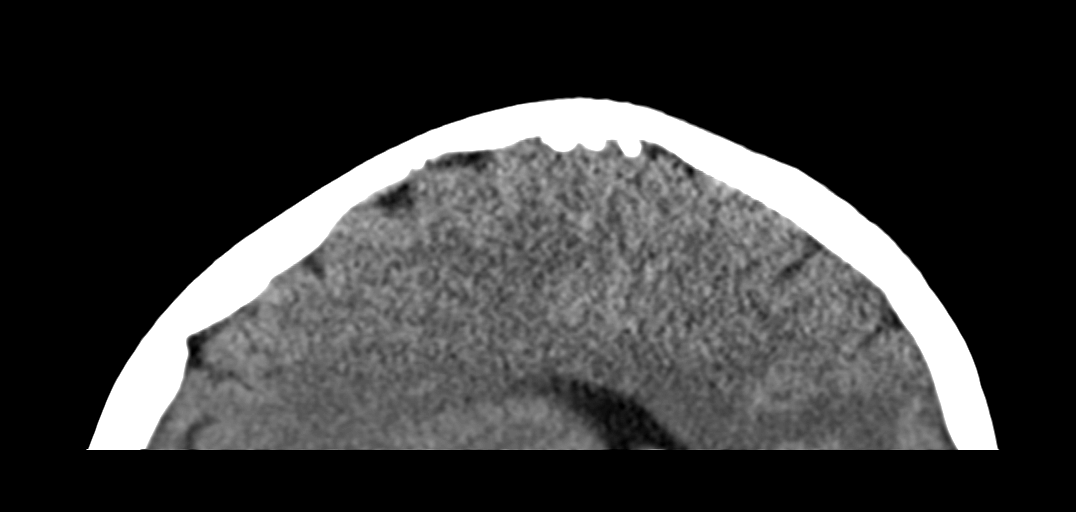
[im 27/45  brain]
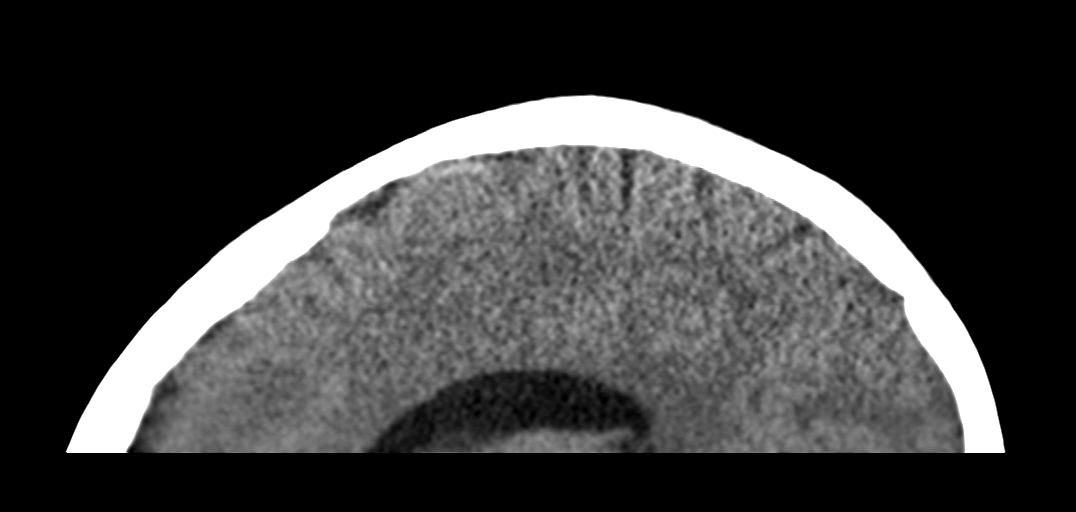

[Series 12: ax head wo · axial · 0.39mm/px · z∈[-357,-298]mm · 3 of 36 slices shown]
[im 6/36  brain]
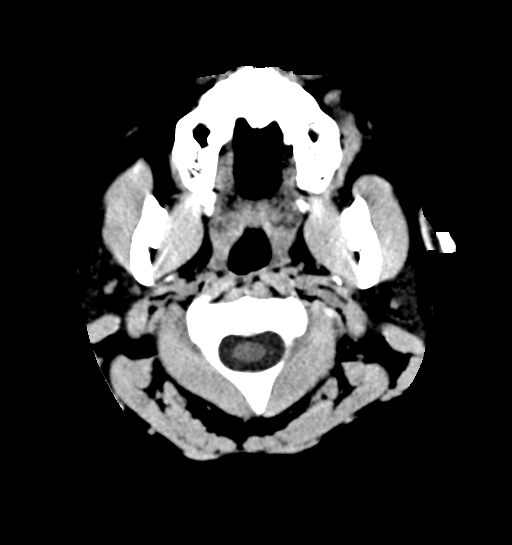
[im 12/36  brain]
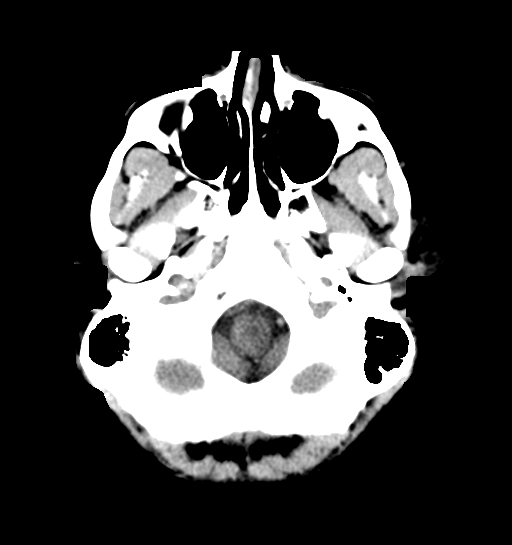
[im 18/36  brain]
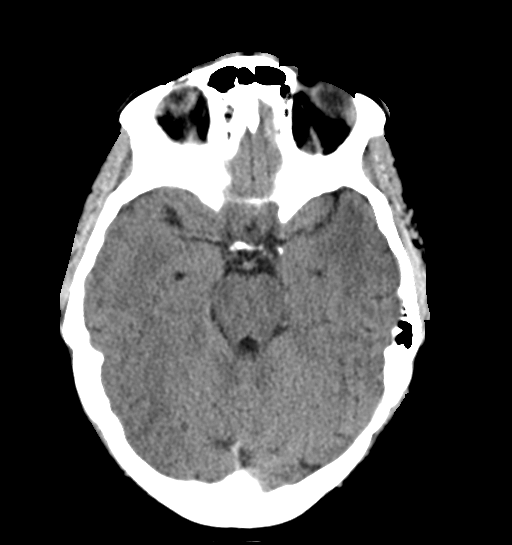

[14 of 47 positions shown; findings below may reference images not displayed]

FINDINGS: Brain: No evidence of acute infarction, hemorrhage, hydrocephalus,
extra-axial collection or mass lesion/mass effect.

Vascular: No hyperdense vessel or unexpected calcification.

Skull: Normal. Negative for fracture or focal lesion.

Sinuses/Orbits: No acute finding.
IMPRESSION: Negative head CT.

## 2021-05-04 ENCOUNTER — Ambulatory Visit: Payer: BC Managed Care – PPO | Admitting: Allergy

## 2021-07-13 ENCOUNTER — Ambulatory Visit: Payer: BC Managed Care – PPO | Admitting: Allergy

## 2023-03-25 DIAGNOSIS — H1010 Acute atopic conjunctivitis, unspecified eye: Secondary | ICD-10-CM | POA: Insufficient documentation

## 2023-03-25 NOTE — Progress Notes (Unsigned)
Follow Up Note  RE: Courtney Mcbride MRN: [id number] DOB: 1996/09/29 Date of Office Visit: 03/26/2023  Referring provider: Kathaleen Bury* Primary care provider: Kerin Salen, PA-C  Chief Complaint: No chief complaint on file.  History of Present Illness: I had the pleasure of seeing Courtney Mcbride for a follow up visit at the [facility] on 03/25/2023. She is a 27 y.o. female, who is being followed for allergic rhinoconjunctivitis and rash. Her previous allergy office visit was on 03/07/2021 with Dr. Selena Batten. Today is a regular follow up visit.  ther allergic rhinitis Rhinoconjunctivitis symptoms for the past 15+ years mainly in the spring.  Tried Zyrtec, Allegra and Flonase with some benefit.  No prior allergy evaluation. Today's skin testing showed: Positive to grass, weed pollen, tree, mold, cat, cockroach.  Start environmental control measures as below. May use over the counter antihistamines such as Zyrtec (cetirizine), Claritin (loratadine), Allegra (fexofenadine), or Xyzal (levocetirizine) daily as needed. May take twice a day during flares. Start dymista (fluticasone + azelastine nasal spray combination) 1 spray per nostril twice a day. This replaces Flonase (fluticasone) for now. If it's not covered let us know.  Start Singulair (montelukast) 10mg  daily at night. Cautioned that in some children/adults can experience behavioral changes including hyperactivity, agitation, depression, sleep disturbances and suicidal ideations. These side effects are rare, but if you notice them you should notify me and discontinue Singulair (montelukast). Nasal saline spray (i.e., Simply Saline) or nasal saline lavage (i.e., NeilMed) is recommended as needed and prior to medicated nasal sprays. May use olopatadine eye drops 0.2% once a day as needed for itchy/watery eyes. Start allergy injections at our [facility] office.  Had a detailed discussion with  patient/family that clinical history is suggestive of allergic rhinitis, and may benefit from allergy immunotherapy (AIT). Discussed in detail regarding the dosing, schedule, side effects (mild to moderate local allergic reaction and rarely systemic allergic reactions including anaphylaxis), and benefits (significant improvement in nasal symptoms, seasonal flares of asthma) of immunotherapy with the patient. There is significant time commitment involved with allergy shots, which includes weekly immunotherapy injections for first 9-12 months and then biweekly to monthly injections for 3-5 years. Consent was signed. I have prescribed epinephrine injectable and demonstrated proper use. For mild symptoms you can take over the counter antihistamines such as Benadryl and monitor symptoms closely. If symptoms worsen or if you have severe symptoms including breathing issues, throat closure, significant swelling, whole body hives, severe diarrhea and vomiting, lightheadedness then inject epinephrine and seek immediate medical care afterwards. Action plan given.   Allergic conjunctivitis of both eyes See assessment and plan as above for allergic rhinitis.   Rash and other nonspecific skin eruption Sometimes breaks out on rash on the right wrist area. No rash today. May use over the counter topical hydrocortisone cream as needed.  Take pictures of the rash See below for proper skin care.    Return in about 4 months (around 07/07/2021).  Assessment and Plan: Courtney Mcbride is a 27 y.o. female with: No problem-specific Assessment & Plan notes found for this encounter.  No follow-ups on file.  No orders of the defined types were placed in this encounter.  Lab Orders  No laboratory test(s) ordered today    Diagnostics: Spirometry:  Tracings reviewed. Her effort: {Blank single:19197::"Good reproducible efforts.","It was hard to get consistent efforts and there is a question as to whether this reflects a maximal  maneuver.","Poor effort, data can not be  interpreted."} FVC: ***L FEV1: ***L, ***% predicted FEV1/FVC ratio: ***% Interpretation: {Blank single:19197::"Spirometry consistent with mild obstructive disease","Spirometry consistent with moderate obstructive disease","Spirometry consistent with severe obstructive disease","Spirometry consistent with possible restrictive disease","Spirometry consistent with mixed obstructive and restrictive disease","Spirometry uninterpretable due to technique","Spirometry consistent with normal pattern","No overt abnormalities noted given today's efforts"}.  Please see scanned spirometry results for details.  Skin Testing: {Blank single:19197::"Select foods","Environmental allergy panel","Environmental allergy panel and select foods","Food allergy panel","None","Deferred due to recent antihistamines use"}. *** Results discussed with patient/family.   Medication List:  Current Outpatient Medications  Medication Sig Dispense Refill  . AUROVELA FE 1/20 1-20 MG-MCG tablet Take 1 tablet by mouth daily.    . Azelastine-Fluticasone 137-50 MCG/ACT SUSP Place 1 spray into the nose in the morning and at bedtime. 23 g 5  . EPINEPHrine (AUVI-Q) 0.3 mg/0.3 mL IJ SOAJ injection Inject 0.3 mg into the muscle as needed for anaphylaxis. 1 each 2  . montelukast (SINGULAIR) 10 MG tablet Take 1 tablet (10 mg total) by mouth at bedtime. 30 tablet 5  . Olopatadine HCl 0.2 % SOLN Apply 1 drop to eye daily as needed (itchy/watery eyes). 2.5 mL 5   No current facility-administered medications for this visit.   Allergies: No Known Allergies I reviewed her past medical history, social history, family history, and environmental history and no significant changes have been reported from her previous visit.  Review of Systems  Constitutional:  Negative for appetite change, chills, fever and unexpected weight change.  HENT:  Negative for congestion and rhinorrhea.   Eyes:  Negative for  itching.  Respiratory:  Negative for cough, chest tightness, shortness of breath and wheezing.   Gastrointestinal:  Negative for abdominal pain.  Skin:  Negative for rash.  Allergic/Immunologic: Positive for environmental allergies.  Neurological:  Negative for headaches.   Objective: There were no vitals taken for this visit. There is no height or weight on file to calculate BMI. Physical Exam Vitals and nursing note reviewed.  Constitutional:      Appearance: Normal appearance. She is well-developed.  HENT:     Head: Normocephalic and atraumatic.     Right Ear: Tympanic membrane and external ear normal.     Left Ear: Tympanic membrane and external ear normal.     Nose: Nose normal.     Mouth/Throat:     Mouth: Mucous membranes are moist.     Pharynx: Oropharynx is clear.  Eyes:     Conjunctiva/sclera: Conjunctivae normal.  Cardiovascular:     Rate and Rhythm: Normal rate and regular rhythm.     Heart sounds: Normal heart sounds. No murmur heard. Pulmonary:     Effort: Pulmonary effort is normal.     Breath sounds: Normal breath sounds. No wheezing, rhonchi or rales.  Musculoskeletal:     Cervical back: Neck supple.  Skin:    General: Skin is warm.     Findings: No rash.  Neurological:     Mental Status: She is alert and oriented to person, place, and time.  Psychiatric:        Behavior: Behavior normal.  Previous notes and tests were reviewed. The plan was reviewed with the patient/family, and all questions/concerned were addressed.  It was my pleasure to see Courtney Mcbride today and participate in her care. Please feel free to contact me with any questions or concerns.  Sincerely,  Wyline Mood, DO Allergy & Immunology  [facility] office: [phone] [facility] office: [phone]

## 2023-03-26 ENCOUNTER — Other Ambulatory Visit: Payer: Self-pay

## 2023-03-26 ENCOUNTER — Encounter: Payer: Self-pay | Admitting: Allergy

## 2023-03-26 ENCOUNTER — Ambulatory Visit (INDEPENDENT_AMBULATORY_CARE_PROVIDER_SITE_OTHER): Payer: 59 | Admitting: Allergy

## 2023-03-26 VITALS — BP 122/78 | HR 110 | Temp 98.4°F | Resp 16 | Ht 66.0 in | Wt 223.2 lb

## 2023-03-26 DIAGNOSIS — J3089 Other allergic rhinitis: Secondary | ICD-10-CM

## 2023-03-26 DIAGNOSIS — H1013 Acute atopic conjunctivitis, bilateral: Secondary | ICD-10-CM

## 2023-03-26 DIAGNOSIS — H1010 Acute atopic conjunctivitis, unspecified eye: Secondary | ICD-10-CM

## 2023-03-26 DIAGNOSIS — J302 Other seasonal allergic rhinitis: Secondary | ICD-10-CM

## 2023-03-26 MED ORDER — RYALTRIS 665-25 MCG/ACT NA SUSP: 1.0000 | Freq: Two times a day (BID) | NASAL | 5 refills | Status: AC

## 2023-03-26 MED ORDER — EPINEPHRINE 0.3 MG/0.3ML IJ SOAJ: 0.3000 mg | INTRAMUSCULAR | 1 refills | Status: AC | PRN

## 2023-03-26 MED ORDER — OLOPATADINE HCL 0.2 % OP SOLN: 1.0000 [drp] | Freq: Every day | OPHTHALMIC | 5 refills | Status: AC | PRN

## 2023-03-26 NOTE — Assessment & Plan Note (Signed)
Past history - rhinoconjunctivitis symptoms for the past 15+ years mainly in the spring.  2022 skin testing showed: Positive to grass, weed pollen, tree, mold, cat, cockroach.  Interim history - worsening symptoms this spring. Wants to start AIT. Start environmental control measures as below. Use over the counter antihistamines such as Zyrtec (cetirizine), Claritin (loratadine), Allegra (fexofenadine), or Xyzal (levocetirizine) daily as needed. May take twice a day during allergy flares. May switch antihistamines every few months. Start Ryaltris (olopatadine + mometasone nasal spray combination) 1-2 sprays per nostril twice a day. Sample given. This replaces your other nasal sprays. If this works well for you, then have [facility] ship the medication to your home - prescription already sent in.  If too expensive let us know and will send in a different nasal spray.  Nasal saline spray (i.e., Simply Saline) or nasal saline lavage (i.e., NeilMed) is recommended as needed and prior to medicated nasal sprays. Use olopatadine eye drops 0.2% once a day as needed for itchy/watery eyes. Start allergy injections - new Rx written. Patient will call back to schedule for first shot appointment once she has new insurance information.  Had a detailed discussion with patient/family that clinical history is suggestive of allergic rhinitis, and may benefit from allergy immunotherapy (AIT). Discussed in detail regarding the dosing, schedule, side effects (mild to moderate local allergic reaction and rarely systemic allergic reactions including anaphylaxis), and benefits (significant improvement in nasal symptoms, seasonal flares of asthma) of immunotherapy with the patient. There is significant time commitment involved with allergy shots, which includes weekly immunotherapy injections for first 9-12 months and then biweekly to monthly injections for 3-5 years. Consent was signed. I have prescribed epinephrine injectable and  demonstrated proper use. For mild symptoms you can take over the counter antihistamines such as Benadryl and monitor symptoms closely. If symptoms worsen or if you have severe symptoms including breathing issues, throat closure, significant swelling, whole body hives, severe diarrhea and vomiting, lightheadedness then inject epinephrine and seek immediate medical care afterwards. Emergency action plan given.

## 2023-03-26 NOTE — Patient Instructions (Addendum)
Environmental allergies 2022 skin testing showed: Positive to grass, weed pollen, tree, mold, cat, cockroach.  Start environmental control measures as below. Use over the counter antihistamines such as Zyrtec (cetirizine), Claritin (loratadine), Allegra (fexofenadine), or Xyzal (levocetirizine) daily as needed. May take twice a day during allergy flares. May switch antihistamines every few months. Start Ryaltris (olopatadine + mometasone nasal spray combination) 1-2 sprays per nostril twice a day. Sample given. This replaces your other nasal sprays. If this works well for you, then have [facility] ship the medication to your home - prescription already sent in.  If too expensive let us know and will send in a different nasal spray.  Nasal saline spray (i.e., Simply Saline) or nasal saline lavage (i.e., NeilMed) is recommended as needed and prior to medicated nasal sprays. Use olopatadine eye drops 0.2% once a day as needed for itchy/watery eyes.  Start allergy injections. Had a detailed discussion with patient/family that clinical history is suggestive of allergic rhinitis, and may benefit from allergy immunotherapy (AIT). Discussed in detail regarding the dosing, schedule, side effects (mild to moderate local allergic reaction and rarely systemic allergic reactions including anaphylaxis), and benefits (significant improvement in nasal symptoms, seasonal flares of asthma) of immunotherapy with the patient. There is significant time commitment involved with allergy shots, which includes weekly immunotherapy injections for first 9-12 months and then biweekly to monthly injections for 3-5 years. Consent was signed. I have prescribed epinephrine injectable and demonstrated proper use. For mild symptoms you can take over the counter antihistamines such as Benadryl and monitor symptoms closely. If symptoms worsen or if you have severe symptoms including breathing issues, throat closure, significant swelling,  whole body hives, severe diarrhea and vomiting, lightheadedness then inject epinephrine and seek immediate medical care afterwards. Emergency action plan given.  Follow up in 3 weeks for first injection and 6 months for regular check up.  Reducing Pollen Exposure Pollen seasons: trees (spring), grass (summer) and ragweed/weeds (fall). Keep windows closed in your home and car to lower pollen exposure.  Install air conditioning in the bedroom and throughout the house if possible.  Avoid going out in dry windy days - especially early morning. Pollen counts are highest between 5 - 10 AM and on dry, hot and windy days.  Save outside activities for late afternoon or after a heavy rain, when pollen levels are lower.  Avoid mowing of grass if you have grass pollen allergy. Be aware that pollen can also be transported indoors on people and pets.  Dry your clothes in an automatic dryer rather than hanging them outside where they might collect pollen.  Rinse hair and eyes before bedtime. Mold Control Mold and fungi can grow on a variety of surfaces provided certain temperature and moisture conditions exist.  Outdoor molds grow on plants, decaying vegetation and soil. The major outdoor mold, Alternaria and Cladosporium, are found in very high numbers during hot and dry conditions. Generally, a late summer - fall peak is seen for common outdoor fungal spores. Rain will temporarily lower outdoor mold spore count, but counts rise rapidly when the rainy period ends. The most important indoor molds are Aspergillus and Penicillium. Dark, humid and poorly ventilated basements are ideal sites for mold growth. The next most common sites of mold growth are the bathroom and the kitchen. Outdoor (Seasonal) Mold Control Use air conditioning and keep windows closed. Avoid exposure to decaying vegetation. Avoid leaf raking. Avoid grain handling. Consider wearing a face mask if working in moldy areas.  Indoor  (Perennial) Mold Control  Maintain humidity below 50%. Get rid of mold growth on hard surfaces with water, detergent and, if necessary, 5% bleach (do not mix with other cleaners). Then dry the area completely. If mold covers an area more than 10 square feet, consider hiring an indoor environmental professional. For clothing, washing with soap and water is best. If moldy items cannot be cleaned and dried, throw them away. Remove sources e.g. contaminated carpets. Repair and seal leaking roofs or pipes. Using dehumidifiers in damp basements may be helpful, but empty the water and clean units regularly to prevent mildew from forming. All rooms, especially basements, bathrooms and kitchens, require ventilation and cleaning to deter mold and mildew growth. Avoid carpeting on concrete or damp floors, and storing items in damp areas.  Pet Allergen Avoidance: Contrary to popular opinion, there are no "hypoallergenic" breeds of dogs or cats. That is because people are not allergic to an animal's hair, but to an allergen found in the animal's saliva, dander (dead skin flakes) or urine. Pet allergy symptoms typically occur within minutes. For some people, symptoms can build up and become most severe 8 to 12 hours after contact with the animal. People with severe allergies can experience reactions in public places if dander has been transported on the pet owners' clothing. Keeping an animal outdoors is only a partial solution, since homes with pets in the yard still have higher concentrations of animal allergens. Before getting a pet, ask your allergist to determine if you are allergic to animals. If your pet is already considered part of your family, try to minimize contact and keep the pet out of the bedroom and other rooms where you spend a great deal of time. As with dust mites, vacuum carpets often or replace carpet with a hardwood floor, tile or linoleum. High-efficiency particulate air (HEPA) cleaners can  reduce allergen levels over time. While dander and saliva are the source of cat and dog allergens, urine is the source of allergens from rabbits, hamsters, mice and [place] pigs; so ask a non-allergic family member to clean the animal's cage. If you have a pet allergy, talk to your allergist about the potential for allergy immunotherapy (allergy shots). This strategy can often provide long-term relief. Cockroach Allergen Avoidance Cockroaches are often found in the homes of densely populated urban areas, schools or commercial buildings, but these creatures can lurk almost anywhere. This does not mean that you have a dirty house or living area. Block all areas where roaches can enter the home. This includes crevices, wall cracks and windows.  Cockroaches need water to survive, so fix and seal all leaky faucets and pipes. Have an exterminator go through the house when your family and pets are gone to eliminate any remaining roaches. Keep food in lidded containers and put pet food dishes away after your pets are done eating. Vacuum and sweep the floor after meals, and take out garbage and recyclables. Use lidded garbage containers in the kitchen. Wash dishes immediately after use and clean under stoves, refrigerators or toasters where crumbs can accumulate. Wipe off the stove and other kitchen surfaces and cupboards regularly.

## 2023-03-27 NOTE — Progress Notes (Signed)
Aeroallergen Immunotherapy   Ordering Provider: Dr. Wyline Mood   Patient Details  Name: Courtney Mcbride  MRN: [id number]  Date of Birth: June 18, 1996   Order 1 of 2   Vial Label: [id number]   0.3 ml (Volume)  BAU Concentration -- 7 Grass Mix* 100,000 ([address] [place], [place], [place], [place], [place], Sweet [place], [place])  0.2 ml (Volume)  1:20 Concentration -- [place]  0.3 ml (Volume)  BAU Concentration -- [place] 10,000  0.2 ml (Volume)  1:20 Concentration -- [place]  0.3 ml (Volume)  1:20 Concentration -- Ragweed Mix  0.5 ml (Volume)  1:20 Concentration -- Weed Mix*  0.5 ml (Volume)  1:20 Concentration -- Eastern 10 Tree Mix (also Sweet Gum)  0.2 ml (Volume)  1:20 Concentration -- Box Elder  0.2 ml (Volume)  1:10 Concentration -- Pecan Pollen  0.2 ml (Volume)  1:10 Concentration -- Pine Mix  0.2 ml (Volume)  1:20 Concentration -- Walnut, Black Pollen    2.9  ml Extract Subtotal  2.1  ml Diluent  5.0  ml Maintenance Total   Schedule:  B  Silver Vial (1:1,000,000): Schedule B (6 doses)  Blue Vial (1:100,000): Schedule B (6 doses)  Yellow Vial (1:10,000): Schedule B (6 doses)  Green Vial (1:1,000): Schedule B (6 doses)  Red Vial (1:100): Schedule A (14 doses)   Special Instructions: may come in twice per week for build up.

## 2023-03-27 NOTE — Progress Notes (Signed)
Aeroallergen Immunotherapy   Ordering Provider: Dr. Wyline Mood   Patient Details  Name: Courtney Mcbride  MRN: [id number]  Date of Birth: 1996/08/07   Order 2 of 2   Vial Label: M-C-Cr   0.2 ml (Volume)  1:20 Concentration -- Alternaria alternata  0.2 ml (Volume)  1:10 Concentration -- Aspergillus mix  0.2 ml (Volume)  1:10 Concentration -- Penicillium mix  0.2 ml (Volume)  1:10 Concentration -- Fusarium moniliforme  0.2 ml (Volume)  1:40 Concentration -- Epicoccum nigrum  0.2 ml (Volume)  1:40 Concentration -- Phoma betae  0.5 ml (Volume)  1:10 Concentration -- Cat Hair  0.3 ml (Volume)  1:20 Concentration -- Cockroach, German    2.2  ml Extract Subtotal  2.8  ml Diluent  5.0  ml Maintenance Total   Schedule:  B  Silver Vial (1:1,000,000): Schedule B (6 doses)  Blue Vial (1:100,000): Schedule B (6 doses)  Yellow Vial (1:10,000): Schedule B (6 doses)  Green Vial (1:1,000): Schedule B (6 doses)  Red Vial (1:100): Schedule A (14 doses)   Special Instructions: may come in twice per week for build up.

## 2023-03-27 NOTE — Progress Notes (Signed)
NOT SCHED TO START.

## 2023-05-14 NOTE — Progress Notes (Signed)
VIALS EXP 05-13-24

## 2023-05-16 DIAGNOSIS — J301 Allergic rhinitis due to pollen: Secondary | ICD-10-CM | POA: Diagnosis not present

## 2023-05-17 DIAGNOSIS — J3081 Allergic rhinitis due to animal (cat) (dog) hair and dander: Secondary | ICD-10-CM | POA: Diagnosis not present

## 2023-05-31 ENCOUNTER — Ambulatory Visit: Payer: 59

## 2023-06-14 ENCOUNTER — Ambulatory Visit (INDEPENDENT_AMBULATORY_CARE_PROVIDER_SITE_OTHER): Payer: 59

## 2023-06-14 DIAGNOSIS — J309 Allergic rhinitis, unspecified: Secondary | ICD-10-CM

## 2023-06-14 NOTE — Progress Notes (Signed)
Immunotherapy   Patient Details  Name: Courtney Mcbride MRN: [id number] Date of Birth: 26-Sep-1996  06/14/2023  Tami Ribas started injections for  molds, cats, cockroaches, grasses, ragweed's, weeds, and trees.  Following schedule: B  Frequency:2 times per week Epi-Pen:Epi-Pen Available  Consent signed previously and patient instructions given. Patient and her mother sat in room twenty seven for thirty minutes without an issue.    Ralene Muskrat 06/14/2023, 10:17 AM

## 2023-06-21 ENCOUNTER — Ambulatory Visit (INDEPENDENT_AMBULATORY_CARE_PROVIDER_SITE_OTHER): Payer: 59

## 2023-06-21 DIAGNOSIS — J309 Allergic rhinitis, unspecified: Secondary | ICD-10-CM | POA: Diagnosis not present

## 2023-06-27 ENCOUNTER — Ambulatory Visit (INDEPENDENT_AMBULATORY_CARE_PROVIDER_SITE_OTHER): Payer: 59

## 2023-06-27 DIAGNOSIS — J309 Allergic rhinitis, unspecified: Secondary | ICD-10-CM

## 2023-07-02 ENCOUNTER — Ambulatory Visit (INDEPENDENT_AMBULATORY_CARE_PROVIDER_SITE_OTHER): Payer: 59

## 2023-07-02 DIAGNOSIS — J309 Allergic rhinitis, unspecified: Secondary | ICD-10-CM | POA: Diagnosis not present

## 2023-07-11 ENCOUNTER — Ambulatory Visit (INDEPENDENT_AMBULATORY_CARE_PROVIDER_SITE_OTHER): Payer: 59 | Admitting: *Deleted

## 2023-07-11 DIAGNOSIS — J309 Allergic rhinitis, unspecified: Secondary | ICD-10-CM

## 2023-07-17 ENCOUNTER — Ambulatory Visit (INDEPENDENT_AMBULATORY_CARE_PROVIDER_SITE_OTHER): Payer: 59 | Admitting: *Deleted

## 2023-07-17 DIAGNOSIS — J309 Allergic rhinitis, unspecified: Secondary | ICD-10-CM

## 2023-07-25 ENCOUNTER — Ambulatory Visit (INDEPENDENT_AMBULATORY_CARE_PROVIDER_SITE_OTHER): Payer: 59 | Admitting: *Deleted

## 2023-07-25 DIAGNOSIS — J309 Allergic rhinitis, unspecified: Secondary | ICD-10-CM | POA: Diagnosis not present

## 2023-08-02 ENCOUNTER — Ambulatory Visit (INDEPENDENT_AMBULATORY_CARE_PROVIDER_SITE_OTHER): Payer: 59

## 2023-08-02 DIAGNOSIS — J309 Allergic rhinitis, unspecified: Secondary | ICD-10-CM

## 2023-08-09 ENCOUNTER — Ambulatory Visit (INDEPENDENT_AMBULATORY_CARE_PROVIDER_SITE_OTHER): Payer: 59

## 2023-08-09 DIAGNOSIS — J309 Allergic rhinitis, unspecified: Secondary | ICD-10-CM

## 2023-08-12 NOTE — Progress Notes (Unsigned)
Follow Up Note  RE: Courtney Mcbride MRN: [id number] DOB: [birth year] Date of Office Visit: 08/13/2023  Referring provider: Kathaleen Bury* Primary care provider: Kerin Salen, PA-C  Chief Complaint: No chief complaint on file.  History of Present Illness: I had the pleasure of seeing Courtney Mcbride for a follow up visit at the [facility] on 08/12/2023. She is a 27 y.o. female, who is being followed for allergic rhinoconjunctivitis on AIT. Her previous allergy office visit was on 03/26/2023 with Dr. Selena Batten. Today is a new complaint visit of allergy shots reactions .  Discussed the use of AI scribe software for clinical note transcription with the patient, who gave verbal consent to proceed.  History of Present Illness           06/14/2023   Courtney Mcbride started injections for  molds, cats, cockroaches, grasses, ragweed's, weeds, and trees.   Seasonal and perennial allergic rhinoconjunctivitis Past history - rhinoconjunctivitis symptoms for the past 15+ years mainly in the spring.  2022 skin testing showed: Positive to grass, weed pollen, tree, mold, cat, cockroach.  Interim history - worsening symptoms this spring. Wants to start AIT. Start environmental control measures as below. Use over the counter antihistamines such as Zyrtec (cetirizine), Claritin (loratadine), Allegra (fexofenadine), or Xyzal (levocetirizine) daily as needed. May take twice a day during allergy flares. May switch antihistamines every few months. Start Ryaltris (olopatadine + mometasone nasal spray combination) 1-2 sprays per nostril twice a day. Sample given. This replaces your other nasal sprays. If this works well for you, then have [facility] ship the medication to your home - prescription already sent in.  If too expensive let us know and will send in a different nasal spray.  Nasal saline spray (i.e., Simply Saline) or nasal saline lavage (i.e., NeilMed) is recommended  as needed and prior to medicated nasal sprays. Use olopatadine eye drops 0.2% once a day as needed for itchy/watery eyes. Start allergy injections - new Rx written. Patient will call back to schedule for first shot appointment once she has new insurance information.  Had a detailed discussion with patient/family that clinical history is suggestive of allergic rhinitis, and may benefit from allergy immunotherapy (AIT). Discussed in detail regarding the dosing, schedule, side effects (mild to moderate local allergic reaction and rarely systemic allergic reactions including anaphylaxis), and benefits (significant improvement in nasal symptoms, seasonal flares of asthma) of immunotherapy with the patient. There is significant time commitment involved with allergy shots, which includes weekly immunotherapy injections for first 9-12 months and then biweekly to monthly injections for 3-5 years. Consent was signed. I have prescribed epinephrine injectable and demonstrated proper use. For mild symptoms you can take over the counter antihistamines such as Benadryl and monitor symptoms closely. If symptoms worsen or if you have severe symptoms including breathing issues, throat closure, significant swelling, whole body hives, severe diarrhea and vomiting, lightheadedness then inject epinephrine and seek immediate medical care afterwards. Emergency action plan given.  Assessment and Plan: [name] is a 27 y.o. 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  2022 skin testing showed: Positive to grass, weed pollen, tree, mold, cat, cockroach.  Interim history   Assessment and Plan  No follow-ups on file.  No orders of the defined types were placed in this  encounter.  Lab Orders  No laboratory test(s) ordered today    Diagnostics: Spirometry:  Tracings reviewed. Her effort: {Blank single:19197::"Good reproducible efforts.","It was hard to get consistent efforts and there is a question as to whether this reflects a maximal maneuver.","Poor effort, data can not be interpreted."} FVC: ***L FEV1: ***L, ***% predicted FEV1/FVC ratio: ***% Interpretation: {Blank single:19197::"Spirometry consistent with mild obstructive disease","Spirometry consistent with moderate obstructive disease","Spirometry consistent with severe obstructive disease","Spirometry consistent with possible restrictive disease","Spirometry consistent with mixed obstructive and restrictive disease","Spirometry uninterpretable due to technique","Spirometry consistent with normal pattern","No overt abnormalities noted given today's efforts"}.  Please see scanned spirometry results for details.  Skin Testing: {Blank single:19197::"Select foods","Environmental allergy panel","Environmental allergy panel and select foods","Food allergy panel","None","Deferred due to recent antihistamines use"}. *** Results discussed with patient/family.   Medication List:  Current Outpatient Medications  Medication Sig Dispense Refill  . amoxicillin-clavulanate (AUGMENTIN) 875-125 MG tablet Take 1 tablet by mouth 2 (two) times daily.    Ronie Spies FE 1/20 1-20 MG-MCG tablet Take 1 tablet by mouth daily. (Patient not taking: Reported on 03/26/2023)    . EPINEPHrine 0.3 mg/0.3 mL IJ SOAJ injection Inject 0.3 mg into the muscle as needed for anaphylaxis. 2 each 1  . montelukast (SINGULAIR) 10 MG tablet Take 1 tablet (10 mg total) by mouth at bedtime. 30 tablet 5  . Olopatadine HCl 0.2 % SOLN Apply 1 drop to eye daily as needed (itchy/watery eyes). 2.5 mL 5  . Olopatadine-Mometasone (RYALTRIS) X543819 MCG/ACT SUSP Place 1-2 sprays into the nose in the morning and at bedtime. 29 g 5   No current  facility-administered medications for this visit.   Allergies: No Known Allergies I reviewed her past medical history, social history, family history, and environmental history and no significant changes have been reported from her previous visit.  Review of Systems  Constitutional:  Negative for appetite change, chills, fever and unexpected weight change.  HENT:  Positive for postnasal drip, rhinorrhea, sneezing and sore throat. Negative for congestion.   Eyes:  Positive for itching.  Respiratory:  Negative for cough, chest tightness, shortness of breath and wheezing.   Gastrointestinal:  Negative for abdominal pain.  Skin:  Negative for rash.  Allergic/Immunologic: Positive for environmental allergies.  Neurological:  Negative for headaches.   Objective: There were no vitals taken for this visit. There is no height or weight on file to calculate BMI. Physical Exam Vitals and nursing note reviewed.  Constitutional:      Appearance: Normal appearance. She is well-developed.  HENT:     Head: Normocephalic and atraumatic.     Right Ear: Tympanic membrane and external ear normal.     Left Ear: Tympanic membrane and external ear normal.     Nose: Nose normal.     Comments: Transverse nasal crease    Mouth/Throat:     Mouth: Mucous membranes are moist.     Pharynx: Oropharynx is clear.  Eyes:     Conjunctiva/sclera: Conjunctivae normal.  Cardiovascular:     Rate and Rhythm: Normal rate and regular rhythm.     Heart sounds: Normal heart sounds. No murmur heard. Pulmonary:     Effort: Pulmonary effort is normal.     Breath sounds: Normal breath sounds. No wheezing, rhonchi or rales.  Musculoskeletal:     Cervical back: Neck supple.  Skin:    General: Skin is warm.  Findings: No rash.  Neurological:     Mental Status: She is alert and oriented to person, place, and time.  Psychiatric:        Behavior: Behavior normal.  Previous notes and tests were reviewed. The plan was  reviewed with the patient/family, and all questions/concerned were addressed.  It was my pleasure to see Lourena today and participate in her care. Please feel free to contact me with any questions or concerns.  Sincerely,  Wyline Mood, DO Allergy & Immunology  [facility] office: [phone] [facility] office: [phone]

## 2023-08-13 ENCOUNTER — Other Ambulatory Visit: Payer: Self-pay

## 2023-08-13 ENCOUNTER — Ambulatory Visit (INDEPENDENT_AMBULATORY_CARE_PROVIDER_SITE_OTHER): Payer: 59 | Admitting: Allergy

## 2023-08-13 ENCOUNTER — Encounter: Payer: Self-pay | Admitting: Allergy

## 2023-08-13 VITALS — BP 130/70 | HR 113 | Temp 98.3°F | Ht 66.0 in | Wt 230.7 lb

## 2023-08-13 DIAGNOSIS — Z91038 Other insect allergy status: Secondary | ICD-10-CM

## 2023-08-13 DIAGNOSIS — J301 Allergic rhinitis due to pollen: Secondary | ICD-10-CM | POA: Diagnosis not present

## 2023-08-13 DIAGNOSIS — H1013 Acute atopic conjunctivitis, bilateral: Secondary | ICD-10-CM

## 2023-08-13 DIAGNOSIS — J3089 Other allergic rhinitis: Secondary | ICD-10-CM | POA: Diagnosis not present

## 2023-08-13 DIAGNOSIS — J3081 Allergic rhinitis due to animal (cat) (dog) hair and dander: Secondary | ICD-10-CM

## 2023-08-13 NOTE — Patient Instructions (Addendum)
Environmental allergies 2022 skin testing showed: Positive to grass, weed pollen, tree, mold, cat, cockroach.  Continue environmental control measures as below. Use over the counter antihistamines such as Zyrtec (cetirizine), Claritin (loratadine), Allegra (fexofenadine), or Xyzal (levocetirizine) daily as needed. May take twice a day during allergy flares. May switch antihistamines every few months. May use Ryaltris (olopatadine + mometasone nasal spray combination) 1-2 sprays per nostril twice a day.  Nasal saline spray (i.e., Simply Saline) or nasal saline lavage (i.e., NeilMed) is recommended as needed and prior to medicated nasal sprays. Use olopatadine eye drops 0.2% once a day as needed for itchy/watery eyes.  Continue allergy injections.  Take your allergy medication twice a day - the day before, the day of and the day after injections. You can also add on famotidine 20mg  twice a day.  May use topical hydrocortisone and/or benadryl cream as needed.  Wait 30 min after each shot. Make sure to have epinephrine divide with you on the days of injections.   Follow up in 6 months or sooner if needed.   Reducing Pollen Exposure Pollen seasons: trees (spring), grass (summer) and ragweed/weeds (fall). Keep windows closed in your home and car to lower pollen exposure.  Install air conditioning in the bedroom and throughout the house if possible.  Avoid going out in dry windy days - especially early morning. Pollen counts are highest between 5 - 10 AM and on dry, hot and windy days.  Save outside activities for late afternoon or after a heavy rain, when pollen levels are lower.  Avoid mowing of grass if you have grass pollen allergy. Be aware that pollen can also be transported indoors on people and pets.  Dry your clothes in an automatic dryer rather than hanging them outside where they might collect pollen.  Rinse hair and eyes before bedtime. Mold Control Mold and fungi can grow on a  variety of surfaces provided certain temperature and moisture conditions exist.  Outdoor molds grow on plants, decaying vegetation and soil. The major outdoor mold, Alternaria and Cladosporium, are found in very high numbers during hot and dry conditions. Generally, a late summer - fall peak is seen for common outdoor fungal spores. Rain will temporarily lower outdoor mold spore count, but counts rise rapidly when the rainy period ends. The most important indoor molds are Aspergillus and Penicillium. Dark, humid and poorly ventilated basements are ideal sites for mold growth. The next most common sites of mold growth are the bathroom and the kitchen. Outdoor (Seasonal) Mold Control Use air conditioning and keep windows closed. Avoid exposure to decaying vegetation. Avoid leaf raking. Avoid grain handling. Consider wearing a face mask if working in moldy areas.  Indoor (Perennial) Mold Control  Maintain humidity below 50%. Get rid of mold growth on hard surfaces with water, detergent and, if necessary, 5% bleach (do not mix with other cleaners). Then dry the area completely. If mold covers an area more than 10 square feet, consider hiring an indoor environmental professional. For clothing, washing with soap and water is best. If moldy items cannot be cleaned and dried, throw them away. Remove sources e.g. contaminated carpets. Repair and seal leaking roofs or pipes. Using dehumidifiers in damp basements may be helpful, but empty the water and clean units regularly to prevent mildew from forming. All rooms, especially basements, bathrooms and kitchens, require ventilation and cleaning to deter mold and mildew growth. Avoid carpeting on concrete or damp floors, and storing items in damp areas.  Pet Allergen  Avoidance: Contrary to popular opinion, there are no "hypoallergenic" breeds of dogs or cats. That is because people are not allergic to an animal's hair, but to an allergen found in the animal's  saliva, dander (dead skin flakes) or urine. Pet allergy symptoms typically occur within minutes. For some people, symptoms can build up and become most severe 8 to 12 hours after contact with the animal. People with severe allergies can experience reactions in public places if dander has been transported on the pet owners' clothing. Keeping an animal outdoors is only a partial solution, since homes with pets in the yard still have higher concentrations of animal allergens. Before getting a pet, ask your allergist to determine if you are allergic to animals. If your pet is already considered part of your family, try to minimize contact and keep the pet out of the bedroom and other rooms where you spend a great deal of time. As with dust mites, vacuum carpets often or replace carpet with a hardwood floor, tile or linoleum. High-efficiency particulate air (HEPA) cleaners can reduce allergen levels over time. While dander and saliva are the source of cat and dog allergens, urine is the source of allergens from rabbits, hamsters, mice and [place] pigs; so ask a non-allergic family member to clean the animal's cage. If you have a pet allergy, talk to your allergist about the potential for allergy immunotherapy (allergy shots). This strategy can often provide long-term relief. Cockroach Allergen Avoidance Cockroaches are often found in the homes of densely populated urban areas, schools or commercial buildings, but these creatures can lurk almost anywhere. This does not mean that you have a dirty house or living area. Block all areas where roaches can enter the home. This includes crevices, wall cracks and windows.  Cockroaches need water to survive, so fix and seal all leaky faucets and pipes. Have an exterminator go through the house when your family and pets are gone to eliminate any remaining roaches. Keep food in lidded containers and put pet food dishes away after your pets are done eating. Vacuum and sweep  the floor after meals, and take out garbage and recyclables. Use lidded garbage containers in the kitchen. Wash dishes immediately after use and clean under stoves, refrigerators or toasters where crumbs can accumulate. Wipe off the stove and other kitchen surfaces and cupboards regularly.

## 2023-08-14 ENCOUNTER — Ambulatory Visit (INDEPENDENT_AMBULATORY_CARE_PROVIDER_SITE_OTHER): Payer: 59

## 2023-08-14 DIAGNOSIS — J309 Allergic rhinitis, unspecified: Secondary | ICD-10-CM

## 2023-08-21 ENCOUNTER — Ambulatory Visit (INDEPENDENT_AMBULATORY_CARE_PROVIDER_SITE_OTHER): Payer: 59 | Admitting: *Deleted

## 2023-08-21 DIAGNOSIS — J309 Allergic rhinitis, unspecified: Secondary | ICD-10-CM | POA: Diagnosis not present

## 2023-08-28 ENCOUNTER — Ambulatory Visit (INDEPENDENT_AMBULATORY_CARE_PROVIDER_SITE_OTHER): Payer: 59 | Admitting: *Deleted

## 2023-08-28 DIAGNOSIS — J309 Allergic rhinitis, unspecified: Secondary | ICD-10-CM | POA: Diagnosis not present

## 2023-09-04 ENCOUNTER — Ambulatory Visit (INDEPENDENT_AMBULATORY_CARE_PROVIDER_SITE_OTHER): Payer: Self-pay | Admitting: *Deleted

## 2023-09-04 DIAGNOSIS — J309 Allergic rhinitis, unspecified: Secondary | ICD-10-CM | POA: Diagnosis not present

## 2023-09-10 ENCOUNTER — Ambulatory Visit (INDEPENDENT_AMBULATORY_CARE_PROVIDER_SITE_OTHER): Payer: Self-pay | Admitting: *Deleted

## 2023-09-10 DIAGNOSIS — J309 Allergic rhinitis, unspecified: Secondary | ICD-10-CM

## 2023-09-17 ENCOUNTER — Ambulatory Visit (INDEPENDENT_AMBULATORY_CARE_PROVIDER_SITE_OTHER): Payer: Self-pay

## 2023-09-17 DIAGNOSIS — J309 Allergic rhinitis, unspecified: Secondary | ICD-10-CM | POA: Diagnosis not present

## 2023-09-25 ENCOUNTER — Ambulatory Visit (INDEPENDENT_AMBULATORY_CARE_PROVIDER_SITE_OTHER): Payer: Self-pay | Admitting: *Deleted

## 2023-09-25 DIAGNOSIS — J309 Allergic rhinitis, unspecified: Secondary | ICD-10-CM | POA: Diagnosis not present

## 2024-02-12 NOTE — Progress Notes (Deleted)
 Follow Up Note  RE: Courtney Mcbride MRN: [id number] DOB: Aug 24, 1996 Date of Office Visit: 02/13/2024  Referring provider: Kathaleen Bury* Primary care provider: Kerin Salen, PA-C  Chief Complaint: No chief complaint on file.  History of Present Illness: I had the pleasure of seeing Courtney Mcbride for a follow up visit at the [facility] on 02/12/2024. She is a 28 y.o. female, who is being followed for allergic rhino conjunctivitis. Her previous allergy office visit was on 08/13/2023 with Dr. Selena Batten. Today is a regular follow up visit.  Discussed the use of AI scribe software for clinical note transcription with the patient, who gave verbal consent to proceed.  History of Present Illness            Did she stop AIT?  Assessment and Plan: Courtney Mcbride is a 28 y.o. 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  2022 skin testing showed: Positive to grass, weed pollen, tree, mold, cat, cockroach.  Interim history - Started AIT on 06/14/2023 (M-C-Cr & G-[id number]) with localized reactions that improved with epiwash and allegra BID dosing.  Continue environmental control measures as below. Use over the counter antihistamines such as Zyrtec (cetirizine), Claritin (loratadine), Allegra (fexofenadine), or Xyzal (levocetirizine) daily as needed. May take twice a day during allergy flares. May switch antihistamines every few months. May use Ryaltris (olopatadine + mometasone nasal spray combination) 1-2 sprays per nostril twice a day.  Nasal saline spray (i.e., Simply Saline) or nasal saline lavage (i.e., NeilMed) is recommended as needed and prior to medicated nasal sprays. Use olopatadine eye drops 0.2% once a day as needed for  itchy/watery eyes. Continue allergy injections.  Take your allergy medication twice a day - the day before, the day of and the day after injections. You can also add on famotidine 20mg  twice a day.  May use topical hydrocortisone and/or benadryl cream as needed.  Wait 30 min after each shot. Make sure to have epinephrine divide with you on the days of injections.  Assessment and Plan              No follow-ups on file.  No orders of the defined types were placed in this encounter.  Lab Orders  No laboratory test(s) ordered today    Diagnostics: Spirometry:  Tracings reviewed. Her effort: {Blank single:19197::"Good reproducible efforts.","It was hard to get consistent efforts and there is a question as to whether this reflects a maximal maneuver.","Poor effort, data can not be interpreted."} FVC: ***L FEV1: ***L, ***% predicted FEV1/FVC ratio: ***% Interpretation: {Blank single:19197::"Spirometry consistent with mild obstructive disease","Spirometry consistent with moderate obstructive disease","Spirometry consistent with severe obstructive disease","Spirometry consistent with possible restrictive disease","Spirometry consistent with mixed obstructive and restrictive disease","Spirometry uninterpretable due to technique","Spirometry consistent with normal pattern","No overt abnormalities noted given today's efforts"}.  Please see scanned spirometry results for details.  Skin Testing: {Blank single:19197::"Select foods","Environmental allergy panel","Environmental allergy panel and select foods","Food allergy panel","None","Deferred due to recent antihistamines use"}. *** Results discussed with patient/family.   Medication List:  Current Outpatient Medications  Medication Sig Dispense Refill   AUROVELA FE 1/20 1-20 MG-MCG tablet Take 1 tablet by mouth daily.     EPINEPHrine 0.3 mg/0.3 mL IJ SOAJ injection Inject 0.3 mg into the muscle as needed for anaphylaxis. (Patient not  taking: Reported on 08/13/2023) 2 each 1   Olopatadine HCl 0.2 % SOLN Apply 1 drop to eye daily as needed (itchy/watery eyes). (Patient not taking: Reported on 08/13/2023) 2.5 mL 5   Olopatadine-Mometasone (RYALTRIS) 665-25 MCG/ACT SUSP Place 1-2 sprays into the nose in the morning and at bedtime. (Patient not taking: Reported on 08/13/2023) 29 g 5   No current facility-administered medications for this visit.   Allergies: Allergies  Allergen Reactions   Singulair [Montelukast]     nightmares   I reviewed her past medical history, social history, family history, and environmental history and no significant changes have been reported from her previous visit.  Review of Systems  Constitutional:  Negative for appetite change, chills, fever and unexpected weight change.  HENT:  Negative for congestion, postnasal drip and rhinorrhea.   Eyes:  Negative for itching.  Respiratory:  Negative for cough, chest tightness, shortness of breath and wheezing.   Skin:  Positive for rash.  Allergic/Immunologic: Positive for environmental allergies.  Neurological:  Negative for headaches.    Objective: There were no vitals taken for this visit. There is no height or weight on file to calculate BMI. Physical Exam Vitals and nursing note reviewed.  Constitutional:      Appearance: Normal appearance. She is well-developed.  HENT:     Head: Normocephalic and atraumatic.     Right Ear: Tympanic membrane and external ear normal.     Left Ear: Tympanic membrane and external ear normal.     Nose: Nose normal.     Comments: Transverse nasal crease    Mouth/Throat:     Mouth: Mucous membranes are moist.     Pharynx: Oropharynx is clear.  Eyes:     Conjunctiva/sclera: Conjunctivae normal.  Cardiovascular:     Rate and Rhythm: Normal rate and regular rhythm.     Heart sounds: Normal heart sounds. No murmur heard. Pulmonary:     Effort: Pulmonary effort is normal.     Breath sounds: Normal breath sounds.  No wheezing, rhonchi or rales.  Musculoskeletal:     Cervical back: Neck supple.  Skin:    General: Skin is warm.     Findings: No rash.  Neurological:     Mental Status: She is alert and oriented to person, place, and time.  Psychiatric:        Behavior: Behavior normal.    Previous notes and tests were reviewed. The plan was reviewed with the patient/family, and all questions/concerned were addressed.  It was my pleasure to see Courtney Mcbride today and participate in her care. Please feel free to contact me with any questions or concerns.  Sincerely,  Wyline Mood, DO Allergy & Immunology  [facility] office: [phone] [facility] office: [phone]

## 2024-02-13 ENCOUNTER — Ambulatory Visit: Payer: 59 | Admitting: Allergy
